# Patient Record
Sex: Male | Born: 1975 | Hispanic: Yes | Marital: Single | Smoking: Current every day smoker
Health system: Southern US, Community
[De-identification: ages and names within clinical notes are randomized; demographics above are authoritative.]

## PROBLEM LIST (undated history)

## (undated) ENCOUNTER — Emergency Department (HOSPITAL_COMMUNITY): Payer: Self-pay

## (undated) DIAGNOSIS — Z789 Other specified health status: Secondary | ICD-10-CM

## (undated) HISTORY — PX: NO PAST SURGERIES: SHX2092

---

## 2013-06-01 ENCOUNTER — Encounter (HOSPITAL_COMMUNITY): Payer: Self-pay | Admitting: Emergency Medicine

## 2013-06-01 ENCOUNTER — Emergency Department (HOSPITAL_COMMUNITY)
Admission: EM | Admit: 2013-06-01 | Discharge: 2013-06-02 | Disposition: A | Discharge status: Other Acute Inpatient Hospital | Payer: Self-pay | Attending: Emergency Medicine | Admitting: Emergency Medicine

## 2013-06-01 DIAGNOSIS — F1210 Cannabis abuse, uncomplicated: Secondary | ICD-10-CM | POA: Insufficient documentation

## 2013-06-01 DIAGNOSIS — R238 Other skin changes: Secondary | ICD-10-CM | POA: Insufficient documentation

## 2013-06-01 DIAGNOSIS — F1010 Alcohol abuse, uncomplicated: Secondary | ICD-10-CM | POA: Insufficient documentation

## 2013-06-01 DIAGNOSIS — F17200 Nicotine dependence, unspecified, uncomplicated: Secondary | ICD-10-CM | POA: Insufficient documentation

## 2013-06-01 LAB — COMPREHENSIVE METABOLIC PANEL
ALK PHOS: 76 U/L (ref 39–117)
ALT: 25 U/L (ref 0–53)
AST: 28 U/L (ref 0–37)
Albumin: 4.2 g/dL (ref 3.5–5.2)
BILIRUBIN TOTAL: 0.4 mg/dL (ref 0.3–1.2)
BUN: 8 mg/dL (ref 6–23)
CO2: 22 meq/L (ref 19–32)
Calcium: 9.5 mg/dL (ref 8.4–10.5)
Chloride: 107 mEq/L (ref 96–112)
Creatinine, Ser: 0.72 mg/dL (ref 0.50–1.35)
GLUCOSE: 93 mg/dL (ref 70–99)
POTASSIUM: 4.5 meq/L (ref 3.7–5.3)
SODIUM: 144 meq/L (ref 137–147)
Total Protein: 7.3 g/dL (ref 6.0–8.3)

## 2013-06-01 LAB — CBC
HEMATOCRIT: 44.1 % (ref 39.0–52.0)
HEMOGLOBIN: 14.8 g/dL (ref 13.0–17.0)
MCH: 31.3 pg (ref 26.0–34.0)
MCHC: 33.6 g/dL (ref 30.0–36.0)
MCV: 93.2 fL (ref 78.0–100.0)
Platelets: 179 10*3/uL (ref 150–400)
RBC: 4.73 MIL/uL (ref 4.22–5.81)
RDW: 12.7 % (ref 11.5–15.5)
WBC: 4.3 10*3/uL (ref 4.0–10.5)

## 2013-06-01 LAB — RAPID URINE DRUG SCREEN, HOSP PERFORMED
Amphetamines: NOT DETECTED
Barbiturates: NOT DETECTED
Benzodiazepines: NOT DETECTED
Cocaine: NOT DETECTED
Opiates: NOT DETECTED
TETRAHYDROCANNABINOL: POSITIVE — AB

## 2013-06-01 LAB — SALICYLATE LEVEL: Salicylate Lvl: <2 mg/dL — ABNORMAL LOW (ref 2.8–20.0)

## 2013-06-01 LAB — ACETAMINOPHEN LEVEL: Acetaminophen (Tylenol), Serum: <15 ug/mL (ref 10–30)

## 2013-06-01 LAB — ETHANOL: Alcohol, Ethyl (B): 84 mg/dL — ABNORMAL HIGH (ref 0–11)

## 2013-06-01 MED ORDER — ONDANSETRON HCL 4 MG PO TABS
4.0000 mg | ORAL_TABLET | Freq: Three times a day (TID) | ORAL | Status: DC | PRN
  Administered 2013-06-01: 4 mg via ORAL
  Filled 2013-06-01: qty 1

## 2013-06-01 MED ORDER — THIAMINE HCL 100 MG/ML IJ SOLN
Freq: Once | INTRAVENOUS | Status: AC
  Administered 2013-06-01: 11:00:00 via INTRAVENOUS
  Filled 2013-06-01: qty 1000

## 2013-06-01 MED ORDER — NICOTINE 21 MG/24HR TD PT24: 21.0000 mg | MEDICATED_PATCH | Freq: Every day | TRANSDERMAL | Status: DC

## 2013-06-01 MED ORDER — LORAZEPAM 2 MG/ML IJ SOLN
1.0000 mg | Freq: Once | INTRAMUSCULAR | Status: AC
  Administered 2013-06-01: 1 mg via INTRAVENOUS
  Filled 2013-06-01: qty 1

## 2013-06-01 MED ORDER — GI COCKTAIL ~~LOC~~
30.0000 mL | Freq: Once | ORAL | Status: AC
  Administered 2013-06-01: 30 mL via ORAL
  Filled 2013-06-01: qty 30

## 2013-06-01 MED ORDER — ACETAMINOPHEN 325 MG PO TABS: 650.0000 mg | ORAL_TABLET | ORAL | Status: DC | PRN

## 2013-06-01 MED ORDER — ALUM & MAG HYDROXIDE-SIMETH 200-200-20 MG/5ML PO SUSP: 30.0000 mL | ORAL | Status: DC | PRN

## 2013-06-01 MED ORDER — IBUPROFEN 400 MG PO TABS: 600.0000 mg | ORAL_TABLET | Freq: Three times a day (TID) | ORAL | Status: DC | PRN

## 2013-06-01 MED ORDER — ZOLPIDEM TARTRATE 5 MG PO TABS
5.0000 mg | ORAL_TABLET | Freq: Every evening | ORAL | Status: DC | PRN
Start: 2013-06-01 — End: 2013-06-02

## 2013-06-01 MED ORDER — LORAZEPAM 1 MG PO TABS: 0.0000 mg | ORAL_TABLET | Freq: Two times a day (BID) | ORAL | Status: DC

## 2013-06-01 MED ORDER — LORAZEPAM 1 MG PO TABS
0.0000 mg | ORAL_TABLET | Freq: Four times a day (QID) | ORAL | Status: DC
  Administered 2013-06-02: 1 mg via ORAL
  Filled 2013-06-01: qty 1

## 2013-06-01 MED ORDER — LORAZEPAM 1 MG PO TABS: 1.0000 mg | ORAL_TABLET | Freq: Three times a day (TID) | ORAL | Status: DC | PRN

## 2013-06-01 NOTE — Progress Notes (Signed)
Writer faxed referral to [facility] and [facility]. 

## 2013-06-01 NOTE — BH Assessment (Signed)
Tele Assessment Note   Perry Mercado is an 38 y.o. hispanic male that requests detox from alcohol.  Patient reports that he has been addicted to alcohol for over 20 years.   Patient reports that he drinks 12-14 beers daily.  Patient reports that he drank 25-30 beers yesterday.  Patient reports that his longest period of sobriety was for 4-5 days.  Patient reports that he and 3 other room mates rent a house. Patient reports that he and his other room mates drink together on a daily basis.  Patient denies previous treatment at another detox or treatment facility.  Patient reports withdrawal symptoms to include sweats, fatigue, slight nausea, cramping, irritability.  Patient denied DT's and seizures.  Patient has a CIWA score of 10.   Patient denies SI/HI/Psychosis.  Patient prior mental health treatment.  Patient denies previous psychiatric hospitalization.  Patient denies a history of physical, sexual or emotional abuse.  During the assessment a translator phone was used.     Axis I: Alcohol Abuse  Axis II: Deferred Axis III: History reviewed. No pertinent past medical history. Axis IV: economic problems, educational problems, housing problems, occupational problems, other psychosocial or environmental problems, problems related to social environment, problems with access to health care services and problems with primary support group Axis V: 31-40 impairment in reality testing  Past Medical History: History reviewed. No pertinent past medical history.  History reviewed. No pertinent past surgical history.  Family History: No family history on file.  Social History:  reports that he has been smoking.  He does not have any smokeless tobacco history on file. He reports that he drinks alcohol. He reports that he does not use illicit drugs.  Additional Social History:     CIWA: CIWA-Ar BP: 109/65 mmHg Pulse Rate: 69 Nausea and Vomiting: mild nausea with no vomiting Tactile Disturbances: mild  itching, pins and needles, burning or numbness Tremor: not visible, but can be felt fingertip to fingertip Auditory Disturbances: not present Paroxysmal Sweats: no sweat visible Visual Disturbances: not present Anxiety: moderately anxious, or guarded, so anxiety is inferred Headache, Fullness in Head: mild Agitation: normal activity Orientation and Clouding of Sensorium: oriented and can do serial additions CIWA-Ar Total: 10 COWS:    Allergies: No Known Allergies  Home Medications:  (Not in a hospital admission)  OB/GYN Status:  No LMP for male patient.  General Assessment Data Location of Assessment: [facility] Is this a Tele or Face-to-Face Assessment?: Tele Assessment Is this an Initial Assessment or a Re-assessment for this encounter?: Initial Assessment Can pt return to current living arrangement?: Yes Admission Status: Voluntary Is patient capable of signing voluntary admission?: Yes Transfer [facility]: [facility] ) Referral Source: Self/Family/Friend  Medical Screening Exam [facility] Walk-in ONLY) Medical Exam completed: Yes  [facility] Crisis Care Plan Name of Psychiatrist: None Reported Name of Therapist: None Reported  Education Status Is patient currently in school?: No Current Grade: NA Highest grade of school patient has completed: NA Name of school: NA Contact person: None Reported  Risk to self Suicidal Ideation: No Suicidal Intent: No Is patient at risk for suicide?: No Suicidal Plan?: No Access to Means: No What has been your use of drugs/alcohol within the last 12 months?: Alcohol Previous Attempts/Gestures: No How many times?: 0 Other Self Harm Risks: None Reported Triggers for Past Attempts: Other (Comment) Intentional Self Injurious Behavior: None Family Suicide History: No Recent stressful life event(s): Job Loss;Financial Problems Persecutory voices/beliefs?: No Depression: Yes Depression  Symptoms: Despondent;Feeling  worthless/self pity Substance abuse history and/or treatment for substance abuse?: Yes Suicide prevention information given to non-admitted patients: Not applicable  Risk to Others Homicidal Ideation: No Thoughts of Harm to Others: No Current Homicidal Intent: No Current Homicidal Plan: No Access to Homicidal Means: No Identified Victim: None Reported History of harm to others?: No Assessment of Violence: None Noted Violent Behavior Description: Calm Does patient have access to weapons?: Yes (Comment) Criminal Charges Pending?: No Does patient have a court date: No  Psychosis Hallucinations: None noted Delusions: None noted  Mental Status Report Appear/Hygiene: Disheveled Eye Contact: Fair Motor Activity: Freedom of movement Speech: Logical/coherent Level of Consciousness: Alert Mood: Anxious Affect: Anxious Anxiety Level: Minimal Thought Processes: Coherent;Relevant Judgement: Unimpaired Orientation: Person;Place;Time;Situation Obsessive Compulsive Thoughts/Behaviors: None  Cognitive Functioning Concentration: Decreased Memory: Remote Intact;Recent Intact IQ: Average Insight: Fair Impulse Control: Poor Appetite: Fair Weight Loss: 0 Weight Gain: 0 Sleep: Decreased Total Hours of Sleep: 4 Vegetative Symptoms: None  ADLScreening [facility] Services) Patient's cognitive ability adequate to safely complete daily activities?: Yes Patient able to express need for assistance with ADLs?: Yes Independently performs ADLs?: Yes (appropriate for developmental age)  Prior Inpatient Therapy Prior Inpatient Therapy: No Prior Therapy Dates: NA Prior Therapy Facilty/Provider(s): NA Reason for Treatment: NA  Prior Outpatient Therapy Prior Outpatient Therapy: No Prior Therapy Dates: NA Prior Therapy Facilty/Provider(s): NA Reason for Treatment: NA  ADL Screening (condition at time of admission) Patient's cognitive ability adequate to safely complete daily  activities?: Yes Patient able to express need for assistance with ADLs?: Yes Independently performs ADLs?: Yes (appropriate for developmental age)         Values / Beliefs Cultural Requests During Hospitalization: None Spiritual Requests During Hospitalization: None     Nutrition Screen- [facility] Adult/[facility]/AP Patient's home diet: Regular  Additional Information 1:1 In Past 12 Months?: No CIRT Risk: No Elopement Risk: No Does patient have medical clearance?: Yes     Disposition: Per, Renata Capriceonrad, NP - patient meets criteria for inpatient hospitalization.  [facility]  Writer will refer patient to other facilities.  Disposition Initial Assessment Completed for this Encounter: Yes Disposition of Patient: Other dispositions Other disposition(s): Other (Comment)  Perry Mercado 06/01/2013 1:56 PM

## 2013-06-01 NOTE — ED Notes (Signed)
Pt here for detox from etoh, here with friend, there is language barrier noted but pt understands why he is here per friend at bedside that does speak "descent Spanish"

## 2013-06-01 NOTE — ED Provider Notes (Signed)
Medical screening examination/treatment/procedure(s) were conducted as a shared visit with non-physician practitioner(s) and myself.  I personally evaluated the patient during the encounter.   EKG Interpretation None       Doug Sou, MD 06/01/13 [phone]

## 2013-06-01 NOTE — ED Notes (Signed)
This RN called [facility]- states has male detox bed for patient. Ava with [facility] notified.

## 2013-06-01 NOTE — ED Notes (Signed)
Ordered diet tray 

## 2013-06-01 NOTE — ED Provider Notes (Signed)
Patient speaks no English history is obtained from medical interpreter using lambert line. Patient requests detox from alcohol. Last drink alcohol last night he presently complains of vague abdominal discomfort, at epigastrium. He feels much improved since being in the emergency department. He is alert Glasgow Coma Score 15. No distress. Maalox ordered  Doug Sou, MD 06/01/13 1216

## 2013-06-01 NOTE — ED Notes (Signed)
telepsych in progress 

## 2013-06-01 NOTE — ED Provider Notes (Signed)
CSN: [id number]     Arrival date & time 06/01/13  5397 History   First MD Initiated Contact with Patient 06/01/13 1015     Chief Complaint  Patient presents with  . Medical Clearance  . Alcohol Problem     (Consider location/radiation/quality/duration/timing/severity/associated sxs/prior Treatment) Patient is a 38 y.o. male presenting with alcohol problem. The history is provided by the patient and medical records.  Alcohol Problem  This is a 38 year old male presenting to the ED for detox from EtOH.  Patient has been a daily drinker for the past 6 years, variable in amount, but up to as many as 30 beers in a 24-hour period.  Last drink was last night around 2200, estimated he had 25+ beers yesterday but lost count.  Denies illicit drug use.  Pt has never attempted detox before.  He states he does feel somewhat shaky and is nauseated.  No vomiting or seizure activity.  No chest pain or SOB.  No abdominal pain.  VS stable on arrival.  History reviewed. No pertinent past medical history. History reviewed. No pertinent past surgical history. No family history on file. History  Substance Use Topics  . Smoking status: Current Every Day Smoker  . Smokeless tobacco: Not on file  . Alcohol Use: Yes    Review of Systems  Psychiatric/Behavioral:       Detox  All other systems reviewed and are negative.     Allergies  Review of patient's allergies indicates no known allergies.  Home Medications   Prior to Admission medications   Not on File   BP 124/78  Pulse 82  Temp(Src) 98.4 F (36.9 C) (Oral)  Resp 18  Ht 5\' 7"  (1.702 m)  Wt 160 lb (72.576 kg)  BMI 25.05 kg/m2  SpO2 98%  Physical Exam  Nursing note and vitals reviewed. Constitutional: He is oriented to person, place, and time. He appears well-developed and well-nourished. No distress.  HENT:  Head: Normocephalic and atraumatic.  Mouth/Throat: Oropharynx is clear and moist.  Dry mucous membranes  Eyes: Conjunctivae  and EOM are normal. Pupils are equal, round, and reactive to light.  Neck: Normal range of motion. Neck supple.  Cardiovascular: Normal rate, regular rhythm and normal heart sounds.   Pulmonary/Chest: Effort normal and breath sounds normal. No respiratory distress. He has no wheezes.  Abdominal: Soft. Bowel sounds are normal. There is no tenderness. There is no guarding.  Musculoskeletal: Normal range of motion. He exhibits no edema.  Neurological: He is alert and oriented to person, place, and time.  Skin: Skin is warm and dry. He is not diaphoretic.  Psychiatric: He has a normal mood and affect.    ED Course  Procedures (including critical care time) Labs Review Labs Reviewed  ETHANOL - Abnormal; Notable for the following:    Alcohol, Ethyl (B) 84 (*)    All other components within normal limits  SALICYLATE LEVEL - Abnormal; Notable for the following:    Salicylate Lvl <2.0 (*)    All other components within normal limits  URINE RAPID DRUG SCREEN (HOSP PERFORMED) - Abnormal; Notable for the following:    Tetrahydrocannabinol POSITIVE (*)    All other components within normal limits  ACETAMINOPHEN LEVEL  CBC  COMPREHENSIVE METABOLIC PANEL    Imaging Review No results found.   EKG Interpretation None      MDM   Final diagnoses:  Alcohol abuse   38 year old male requesting detox from alcohol. He has been a daily drinker for  the past 6 years, last drink was around 10 PM last night. On exam, patient is afebrile and overall nontoxic appearing. He has no noted tremors or seizure activity. Some nausea but denies vomiting. Patient is not actively withdrawing at this time.  Basic labs were obtained. He was given a banana bag of fluids and dose of Ativan and zofran.  Labs as above, ethanol 84. UDS positive for THC. After medications, patient states he feels better. He is requesting to eat. Meal tray was ordered for him. Pt is medically cleared and awaiting [facility] evaluation.  VS  remain stable.  [facility] has evaluated pt-- meets IP criteria, awaiting placement.  Garlon HatchetLisa M Demarco Bacci, PA-C 06/01/13 [phone]

## 2013-06-01 NOTE — Progress Notes (Signed)
Per Renata Caprice, NP - patient meets criteria for inpatient hospitalization.

## 2013-06-02 ENCOUNTER — Inpatient Hospital Stay (HOSPITAL_COMMUNITY)
Admission: AD | Admit: 2013-06-02 | Discharge: 2013-06-04 | DRG: 897 | Disposition: A | Discharge status: Home / Self Care | Payer: Federal, State, Local not specified - Other | Source: Intra-hospital | Attending: Psychiatry | Admitting: Psychiatry

## 2013-06-02 ENCOUNTER — Encounter (HOSPITAL_COMMUNITY): Payer: Self-pay | Admitting: *Deleted

## 2013-06-02 ENCOUNTER — Encounter (HOSPITAL_COMMUNITY): Payer: Self-pay | Admitting: Emergency Medicine

## 2013-06-02 DIAGNOSIS — F1010 Alcohol abuse, uncomplicated: Secondary | ICD-10-CM | POA: Diagnosis present

## 2013-06-02 DIAGNOSIS — F1020 Alcohol dependence, uncomplicated: Principal | ICD-10-CM | POA: Diagnosis present

## 2013-06-02 DIAGNOSIS — G4700 Insomnia, unspecified: Secondary | ICD-10-CM | POA: Diagnosis present

## 2013-06-02 DIAGNOSIS — F320 Major depressive disorder, single episode, mild: Secondary | ICD-10-CM | POA: Diagnosis present

## 2013-06-02 DIAGNOSIS — F17200 Nicotine dependence, unspecified, uncomplicated: Secondary | ICD-10-CM | POA: Diagnosis present

## 2013-06-02 HISTORY — DX: Other specified health status: Z78.9

## 2013-06-02 MED ORDER — CHLORDIAZEPOXIDE HCL 25 MG PO CAPS: 25.0000 mg | ORAL_CAPSULE | ORAL | Status: DC

## 2013-06-02 MED ORDER — CHLORDIAZEPOXIDE HCL 25 MG PO CAPS: 25.0000 mg | ORAL_CAPSULE | Freq: Every day | ORAL | Status: DC

## 2013-06-02 MED ORDER — THIAMINE HCL 100 MG/ML IJ SOLN: 100.0000 mg | Freq: Once | INTRAMUSCULAR | Status: DC

## 2013-06-02 MED ORDER — LOPERAMIDE HCL 2 MG PO CAPS: 2.0000 mg | ORAL_CAPSULE | ORAL | Status: DC | PRN

## 2013-06-02 MED ORDER — ALUM & MAG HYDROXIDE-SIMETH 200-200-20 MG/5ML PO SUSP: 30.0000 mL | ORAL | Status: DC | PRN

## 2013-06-02 MED ORDER — VITAMIN B-1 100 MG PO TABS
100.0000 mg | ORAL_TABLET | Freq: Every day | ORAL | Status: DC
  Administered 2013-06-03 – 2013-06-04 (×2): 100 mg via ORAL
  Filled 2013-06-02 (×4): qty 1

## 2013-06-02 MED ORDER — MAGNESIUM HYDROXIDE 400 MG/5ML PO SUSP: 30.0000 mL | Freq: Every day | ORAL | Status: DC | PRN

## 2013-06-02 MED ORDER — HYDROXYZINE HCL 25 MG PO TABS: 25.0000 mg | ORAL_TABLET | Freq: Four times a day (QID) | ORAL | Status: DC | PRN

## 2013-06-02 MED ORDER — TRAZODONE HCL 50 MG PO TABS
50.0000 mg | ORAL_TABLET | Freq: Every evening | ORAL | Status: DC | PRN
  Administered 2013-06-02 – 2013-06-03 (×2): 50 mg via ORAL
  Filled 2013-06-02: qty 1
  Filled 2013-06-02: qty 28
  Filled 2013-06-02: qty 1
  Filled 2013-06-02 (×2): qty 28
  Filled 2013-06-02: qty 1
  Filled 2013-06-02: qty 28
  Filled 2013-06-02 (×3): qty 1

## 2013-06-02 MED ORDER — CHLORDIAZEPOXIDE HCL 25 MG PO CAPS
25.0000 mg | ORAL_CAPSULE | Freq: Four times a day (QID) | ORAL | Status: AC
  Administered 2013-06-02 – 2013-06-04 (×6): 25 mg via ORAL
  Filled 2013-06-02 (×5): qty 1

## 2013-06-02 MED ORDER — ACETAMINOPHEN 325 MG PO TABS: 650.0000 mg | ORAL_TABLET | Freq: Four times a day (QID) | ORAL | Status: DC | PRN

## 2013-06-02 MED ORDER — CHLORDIAZEPOXIDE HCL 25 MG PO CAPS: 25.0000 mg | ORAL_CAPSULE | Freq: Three times a day (TID) | ORAL | Status: DC

## 2013-06-02 MED ORDER — ADULT MULTIVITAMIN W/MINERALS CH
1.0000 | ORAL_TABLET | Freq: Every day | ORAL | Status: DC
  Administered 2013-06-03 – 2013-06-04 (×2): 1 via ORAL
  Filled 2013-06-02 (×4): qty 1

## 2013-06-02 MED ORDER — ONDANSETRON 4 MG PO TBDP: 4.0000 mg | ORAL_TABLET | Freq: Four times a day (QID) | ORAL | Status: DC | PRN

## 2013-06-02 MED ORDER — CHLORDIAZEPOXIDE HCL 25 MG PO CAPS: 25.0000 mg | ORAL_CAPSULE | Freq: Four times a day (QID) | ORAL | Status: DC | PRN

## 2013-06-02 NOTE — ED Notes (Signed)
PATIENT HAS BEEN DECLINED AT [facility] PER MELISSA

## 2013-06-02 NOTE — Tx Team (Signed)
Initial Interdisciplinary Treatment Plan  PATIENT STRENGTHS: (choose at least two) Ability for insight Active sense of humor Average or above average intelligence Capable of independent living Motivation for treatment/growth Physical Health Supportive family/friends Work skills  PATIENT STRESSORS: Substance abuse   PROBLEM LIST: Problem List/Patient Goals Date to be addressed Date deferred Reason deferred Estimated date of resolution  I want to get off alcohol 02 Jun 2013                                                      DISCHARGE CRITERIA:  Ability to meet basic life and health needs Adequate post-discharge living arrangements Improved stabilization in mood, thinking, and/or behavior Verbal commitment to aftercare and medication compliance Withdrawal symptoms are absent or subacute and managed without 24-hour nursing intervention  PRELIMINARY DISCHARGE PLAN: Attend aftercare/continuing care group Return to previous work or school arrangements  PATIENT/FAMIILY INVOLVEMENT: This treatment plan has been presented to and reviewed with the patient, Perry Mercado, and/or family member.  The patient and family have been given the opportunity to ask questions and make suggestions.  Abby Tucholski M Dietra Stokely 06/02/2013, 7:00 PM

## 2013-06-02 NOTE — ED Notes (Signed)
SPOKE TO NICOLE AT [facility] . PATIENT HAS BEEN DECLINED AT THEIR FACILITY

## 2013-06-02 NOTE — ED Notes (Signed)
CALLED AND MADE PAIGE AWARE THAT PT HAS BEEN DECLINED AT [facility] AND [facility]. PAIGE IS GOING TO CHECK AND SEE IF THERE WILL BE [address]ED[address] TODAY. SHE WILL CALL BACK

## 2013-06-02 NOTE — [facility] Counselor (Addendum)
Update - after speaking with Renata Caprice NP and Julieanne Cotton, pt will be going to bed 303-2. Writer notified Musician.  Evette Cristal, [place] Assessment Counselor 12:41 pm   Per Julieanne Cotton at [facility], pt accepted to OBS #1. Writer called and spoke w/ Drinda Butts RN who will let Becky RN know.  Evette Cristal, [place] Assessment Counselor 12:31 pm

## 2013-06-02 NOTE — Progress Notes (Signed)
Patient ID: Perry Mercado, male   DOB: 11/22/1976, 38 y.o.   MRN: [id number] D:  38 year old Latino male admitted for alcohol detox.  Patient states he drinks 12-24 beers every 2-3 days.  States he only smokes cigarettes when he is drinking.  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  States his boss is supportive.  He speaks minimal [place].    A:  Admission completed to the best of my ability.  He was introduced to Dr. Dub Mikes in order to build his comfort level.  Patient was oriented to the unit and escorted to the dining room for dinner.   R:  Patient was very pleasant and cooperative with the admission process.  He was able to work through the admission with my minimal Spanish and his minimal [place].  He was able to verbalize that he wants to stop drinking alcohol completely.  States "no more."  Skin assessment revealed some well healed scars on his upper chest, otherwise unremarkable.

## 2013-06-02 NOTE — ED Notes (Signed)
Pt states the last time he had a drink was Sunday, 25 beers.

## 2013-06-02 NOTE — Discharge Instructions (Signed)
TO [facility] FOR ADMISSION - ACCEPTED BY DR. Dub Mikes.

## 2013-06-02 NOTE — ED Provider Notes (Signed)
On morning rounds the patient was sleeping. Per report, no new complaints, no rhythm abnormalities, patient is awaiting assistance for placement.  Gerhard Munch, MD 06/02/13 1202

## 2013-06-03 ENCOUNTER — Encounter (HOSPITAL_COMMUNITY): Payer: Self-pay | Admitting: Psychiatry

## 2013-06-03 DIAGNOSIS — F1020 Alcohol dependence, uncomplicated: Secondary | ICD-10-CM | POA: Diagnosis present

## 2013-06-03 DIAGNOSIS — F1994 Other psychoactive substance use, unspecified with psychoactive substance-induced mood disorder: Secondary | ICD-10-CM

## 2013-06-03 NOTE — Progress Notes (Signed)
Adult Psychoeducational Group Note  Date:  06/03/2013 Time:  12:56 AM  Group Topic/Focus:  Wrap-Up Group:   The focus of this group is to help patients review their daily goal of treatment and discuss progress on daily workbooks.  Participation Level:  Did Not Attend   Additional Comments:  Pt did not attend wrap up group. He slept instead.    Megumi Treaster A Keylah Darwish 06/03/2013, 12:56 AM 

## 2013-06-03 NOTE — [facility] Group Notes (Signed)
[facility] LCSW Group Therapy  06/03/2013 3:27 PM  Type of Therapy:  Group Therapy  Participation Level:  Minimal  Participation Quality:  Attentive  Affect:  Appropriate  Cognitive:  Alert and Oriented  Insight:  Improving  Engagement in Therapy:  Improving  Modes of Intervention:  Confrontation, Discussion, Education, Exploration, Problem-solving, Rapport Building, Socialization and Support  Summary of Progress/Problems: Today's Topic: Overcoming Obstacles. Pt identified obstacles faced currently and processed barriers involved in overcoming these obstacles. Pt identified steps necessary for overcoming these obstacles and explored motivation (internal and external) for facing these difficulties head on. Pt further identified one area of concern in their lives and chose a skill of focus pulled from their "toolbox." Perry Mercado was attentive and engaged throughout today's therapy group. He minimally participated in group discussion unless directly asked a question by CSW. Pt listened attentively as others participated in group discussion and had interpretor with him during group. Perry Mercado shared that he plans to move out of his home at d/c being that his roommates drink heavily and reported that this is not a good environment for him. PT reports that he is anxious to return to work and d/c tomorrow. Perry Mercado shows progress in the group setting and improving insight AEB his ability to process how returning to work, going to therapy, taking his medications on time, and staying away from negative influences will help him to maintain sobriety in the future.    Perry Mercado Smart LCSWA  06/03/2013, 3:27 PM

## 2013-06-03 NOTE — [facility] Suicide Risk Assessment (Signed)
[facility] INPATIENT: Family/Significant Other Suicide Prevention Education  Suicide Prevention Education:  Education Completed; No one has been identified by the patient as the family member/significant other with whom the patient will be residing, and identified as the person(s) who will aid the patient in the event of a mental health crisis (suicidal ideations/suicide attempt).   Pt did not c/o SI at admission, nor have they endorsed SI during their stay here. SPE not required. SPI pamphlet provided to pt and he was encouraged to share information with support network, ask questions, and talk about any concerns relating to SPE.  [employer], LCSWA 06/03/2013 11:37 AM

## 2013-06-03 NOTE — H&P (Signed)
Psychiatric Admission Assessment Adult  Patient Identification:  Perry Mercado Date of Evaluation:  06/03/2013 Chief Complaint:  ALCOHOL ABUSE History of Present Illness:: 38 Y/O male who states he drinks heavily on weekends. States during the weekdays he drinks one or two on weekends can drink up to 25 beers in a day. States he has been drinking since he was 54. States the longest time abstinent has been a week, two weeks. States when he drinks he smokes marijuana. 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 He states his boss knows he is here and is supportive of him. He wants to eventually have a company of his own. He stays with some "frineds" they all drink and do drugs what he admits triggers him. He is planning to get another place to live.  Associated Signs/Synptoms: Depression Symptoms:  disturbed sleep, (Hypo) Manic Symptoms:  denies Anxiety Symptoms:  denies Psychotic Symptoms:  denies PTSD Symptoms: Negative Total Time spent with patient: 45 minutes  Psychiatric Specialty Exam: Physical Exam  Review of Systems  Constitutional: Positive for malaise/fatigue.  HENT: Negative.   Eyes: Negative.   Respiratory: Negative.   Cardiovascular: Negative.   Gastrointestinal: Negative.   Genitourinary: Negative.   Musculoskeletal: Negative.   Skin: Negative.   Neurological: Positive for weakness.  Endo/Heme/Allergies: Negative.   Psychiatric/Behavioral: Positive for substance abuse. The patient is nervous/anxious and has insomnia.     Blood pressure 116/86, pulse 99, temperature 98.5 F (36.9 C), temperature source Oral, resp. rate 18, height $RemoveBe'6\' 8"'GzfmyAvmu$  (2.032 m), weight 79.833 kg (176 lb).Body mass index is 19.33 kg/(m^2).  General Appearance: Fairly Groomed  Engineer, water::  Fair  Speech:  Clear and Coherent  Volume:  Normal  Mood:  worried  Affect:  worried  Thought Process:  Coherent and Goal Directed  Orientation:  Full (Time, Place, and Person)  Thought  Content:  symptoms, worries, concerns  Suicidal Thoughts:  No  Homicidal Thoughts:  No  Memory:  Immediate;   Fair Recent;   Fair Remote;   Fair  Judgement:  Fair  Insight:  Present  Psychomotor Activity:  Restlessness  Concentration:  Fair  Recall:  [employer] of Knowledge:NA  Language: Fair  Akathisia:  No  Handed:    AIMS (if indicated):     Assets:  Desire for Improvement Vocational/Educational  Sleep:  Number of Hours: 6.25    Musculoskeletal: Strength & Muscle Tone: within normal limits Gait & Station: normal Patient leans: N/A  Past Psychiatric History: Diagnosis:  Hospitalizations: Denies  Outpatient Care: Denies  Substance Abuse Care: Denies  Self-Mutilation: Denies  Suicidal Attempts: Denies  Violent Behaviors: Denies   Past Medical History:   Past Medical History  Diagnosis Date  . Medical history non-contributory    None. Allergies:  No Known Allergies PTA Medications: No prescriptions prior to admission    Previous Psychotropic Medications:  Medication/Dose                 Substance Abuse History in the last 12 months:  yes  Consequences of Substance Abuse: Blackouts:   Withdrawal Symptoms:   Diaphoresis Headaches Nausea Tremors  Social History:  reports that he has been smoking Cigarettes.  He has been smoking about 2.00 packs per day. He does not have any smokeless tobacco history on file. He reports that he drinks alcohol. He reports that he does not use illicit drugs. Additional Social History:  Current Place of Residence:  Lives with some "friends" but states he will move as they drink and use drugs Place of Birth:   Family Members: Marital Status:  Single Children: Denes  Sons:  Daughters: Relationships: Education:  5 th grade in [place] Educational Problems/Performance: Religious Beliefs/Practices: Catholic  History of Abuse (Emotional/Phsycial/Sexual) Denies Pensions consultant;  Construction/Painting  Military History:  None. Legal History: Public intoxication Hobbies/Interests:  Family History:  No family history on file.  Results for orders placed during the hospital encounter of 06/01/13 (from the past 72 hour(s))  ACETAMINOPHEN LEVEL     Status: None   Collection Time    06/01/13 10:35 AM      Result Value Ref Range   Acetaminophen (Tylenol), Serum <15.0  10 - 30 ug/mL   Comment:            THERAPEUTIC CONCENTRATIONS VARY     SIGNIFICANTLY. A RANGE OF 10-30     ug/mL MAY BE AN EFFECTIVE     CONCENTRATION FOR MANY PATIENTS.     HOWEVER, SOME ARE BEST TREATED     AT CONCENTRATIONS OUTSIDE THIS     RANGE.     ACETAMINOPHEN CONCENTRATIONS     >150 ug/mL AT 4 HOURS AFTER     INGESTION AND >50 ug/mL AT 12     HOURS AFTER INGESTION ARE     OFTEN ASSOCIATED WITH TOXIC     REACTIONS.  CBC     Status: None   Collection Time    06/01/13 10:35 AM      Result Value Ref Range   WBC 4.3  4.0 - 10.5 K/uL   RBC 4.73  4.22 - 5.81 MIL/uL   Hemoglobin 14.8  13.0 - 17.0 g/dL   HCT 44.1  39.0 - 52.0 %   MCV 93.2  78.0 - 100.0 fL   MCH 31.3  26.0 - 34.0 pg   MCHC 33.6  30.0 - 36.0 g/dL   RDW 12.7  11.5 - 15.5 %   Platelets 179  150 - 400 K/uL  COMPREHENSIVE METABOLIC PANEL     Status: None   Collection Time    06/01/13 10:35 AM      Result Value Ref Range   Sodium 144  137 - 147 mEq/L   Potassium 4.5  3.7 - 5.3 mEq/L   Chloride 107  96 - 112 mEq/L   CO2 22  19 - 32 mEq/L   Glucose, Bld 93  70 - 99 mg/dL   BUN 8  6 - 23 mg/dL   Creatinine, Ser 0.72  0.50 - 1.35 mg/dL   Calcium 9.5  8.4 - 10.5 mg/dL   Total Protein 7.3  6.0 - 8.3 g/dL   Albumin 4.2  3.5 - 5.2 g/dL   AST 28  0 - 37 U/L   ALT 25  0 - 53 U/L   Alkaline Phosphatase 76  39 - 117 U/L   Total Bilirubin 0.4  0.3 - 1.2 mg/dL   GFR calc non Af Amer >90  >90 mL/min   GFR calc Af Amer >90  >90 mL/min   Comment: (NOTE)     The eGFR has been calculated using the CKD EPI equation.     This  calculation has not been validated in all clinical situations.     eGFR's persistently <90 mL/min signify possible Chronic Kidney     Disease.  ETHANOL     Status: Abnormal   Collection Time  06/01/13 10:35 AM      Result Value Ref Range   Alcohol, Ethyl (B) 84 (*) 0 - 11 mg/dL   Comment:            LOWEST DETECTABLE LIMIT FOR     SERUM ALCOHOL IS 11 mg/dL     FOR MEDICAL PURPOSES ONLY  SALICYLATE LEVEL     Status: Abnormal   Collection Time    06/01/13 10:35 AM      Result Value Ref Range   Salicylate Lvl <9.6 (*) 2.8 - 20.0 mg/dL  URINE RAPID DRUG SCREEN (HOSP PERFORMED)     Status: Abnormal   Collection Time    06/01/13 10:44 AM      Result Value Ref Range   Opiates NONE DETECTED  NONE DETECTED   Cocaine NONE DETECTED  NONE DETECTED   Benzodiazepines NONE DETECTED  NONE DETECTED   Amphetamines NONE DETECTED  NONE DETECTED   Tetrahydrocannabinol POSITIVE (*) NONE DETECTED   Barbiturates NONE DETECTED  NONE DETECTED   Comment:            DRUG SCREEN FOR MEDICAL PURPOSES     ONLY.  IF CONFIRMATION IS NEEDED     FOR ANY PURPOSE, NOTIFY LAB     WITHIN 5 DAYS.                LOWEST DETECTABLE LIMITS     FOR URINE DRUG SCREEN     Drug Class       Cutoff (ng/mL)     Amphetamine      1000     Barbiturate      200     Benzodiazepine   789     Tricyclics       381     Opiates          300     Cocaine          300     THC              50   Psychological Evaluations:  Assessment:   DSM5:  Schizophrenia Disorders:  denies Obsessive-Compulsive Disorders:  denies Trauma-Stressor Disorders:  denies Substance/Addictive Disorders:  Alcohol Related Disorder - Severe (303.90) Depressive Disorders:  Major Depressive Disorder - Mild (296.21)  AXIS I:  Substance Induced Mood Disorder AXIS II:  Deferred AXIS III:   Past Medical History  Diagnosis Date  . Medical history non-contributory    AXIS IV:  other psychosocial or environmental problems AXIS V:  41-50 serious  symptoms  Treatment Plan/Recommendations:  Supportive approach/coping skills/relapse prevention                                                                 Librium detox protocol  Treatment Plan Summary: Daily contact with patient to assess and evaluate symptoms and progress in treatment Medication management Current Medications:  Current Facility-Administered Medications  Medication Dose Route Frequency Provider Last Rate Last Dose  . acetaminophen (TYLENOL) tablet 650 mg  650 mg Oral Q6H PRN Benjamine Mola, FNP      . alum & mag hydroxide-simeth (MAALOX/MYLANTA) 200-200-20 MG/5ML suspension 30 mL  30 mL Oral Q4H PRN Benjamine Mola, FNP      . chlordiazePOXIDE (LIBRIUM) capsule 25 mg  25 mg Oral Q6H PRN Laverle Hobby, PA-C      . chlordiazePOXIDE (LIBRIUM) capsule 25 mg  25 mg Oral QID Laverle Hobby, PA-C   25 mg at 06/03/13 0810   Followed by  . [START ON 06/04/2013] chlordiazePOXIDE (LIBRIUM) capsule 25 mg  25 mg Oral TID Laverle Hobby, PA-C       Followed by  . [START ON 06/05/2013] chlordiazePOXIDE (LIBRIUM) capsule 25 mg  25 mg Oral BH-qamhs Spencer E Simon, PA-C       Followed by  . [START ON 06/07/2013] chlordiazePOXIDE (LIBRIUM) capsule 25 mg  25 mg Oral Daily Laverle Hobby, PA-C      . hydrOXYzine (ATARAX/VISTARIL) tablet 25 mg  25 mg Oral Q6H PRN Laverle Hobby, PA-C      . loperamide (IMODIUM) capsule 2-4 mg  2-4 mg Oral PRN Laverle Hobby, PA-C      . magnesium hydroxide (MILK OF MAGNESIA) suspension 30 mL  30 mL Oral Daily PRN Benjamine Mola, FNP      . multivitamin with minerals tablet 1 tablet  1 tablet Oral Daily Laverle Hobby, PA-C   1 tablet at 06/03/13 0810  . ondansetron (ZOFRAN-ODT) disintegrating tablet 4 mg  4 mg Oral Q6H PRN Laverle Hobby, PA-C      . thiamine (B-1) injection 100 mg  100 mg Intramuscular Once [employer], PA-C      . thiamine (VITAMIN B-1) tablet 100 mg  100 mg Oral Daily Laverle Hobby, PA-C   100 mg at 06/03/13 0810  . traZODone  (DESYREL) tablet 50 mg  50 mg Oral QHS,MR X 1 Spencer E Simon, PA-C   50 mg at 06/02/13 2200    Observation Level/Precautions:  15 minute checks  Laboratory:  As per the ED  Psychotherapy:  Individual/group  Medications:  Librium detox/reassessed and address the co morbidities  Consultations:    Discharge Concerns:    Estimated LOS: 3-5 days  Other:     I certify that inpatient services furnished can reasonably be expected to improve the patient's condition.   Nicholaus Bloom 6/3/20159:18 AM

## 2013-06-03 NOTE — Tx Team (Signed)
Interdisciplinary Treatment Plan Update (Adult)  Date: 06/03/2013   Time Reviewed: 11:17 AM  Progress in Treatment:  Attending groups:yes  Participating in groups:  Yes  Taking medication as prescribed: Yes  Tolerating medication: Yes  Family/Significant othe contact made: No. SPE not required for this pt.   Patient understands diagnosis: Yes, AEB seeking treatment for ETOH detox and mood stabilization.  Discussing patient identified problems/goals with staff: Yes  Medical problems stabilized or resolved: Yes  Denies suicidal/homicidal ideation: Yes during admission, group, and self report.  Patient has not harmed self or Others: Yes  New problem(s) identified: Pt speaks minimal English. RN Maureen Ralphs) requesting translator for assistance. CSW made request this morning.  Discharge Plan or Barriers: Pt plans to return home and follow up at [facility]. He is ready to return to work and was hoping for d/c today. Per MD, likely d/c tomorrow.  Additional comments:  38 year old Latino male admitted for alcohol detox. Patient states he drinks 12-24 beers every 2-3 days. States he only smokes cigarettes when he is drinking. 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 States his boss is supportive. He speaks minimal [place].  Admission completed to the best of my ability. He was introduced to Dr. Dub Mikes in order to build his comfort level. Patient was oriented to the unit and escorted to the dining room for dinner. Patient was very pleasant and cooperative with the admission process. He was able to work through the admission with my minimal Spanish and his minimal [place]. He was able to verbalize that he wants to stop drinking alcohol completely. States "no more."  Reason for Continuation of Hospitalization: Librium taper-withdrawals  Mood stabilization Medication management  Estimated length of stay: 1 day (likely d/c Thursday) For review of initial/current patient goals, please  see plan of care.  Attendees:  Patient:    Family:    Physician: Geoffery Lyons MD 06/03/2013 11:17 AM   Nursing: Lupita Leash RN  06/03/2013 11:17 AM   Clinical Social Worker Jaymie Mckiddy Smart, LCSWA  06/03/2013 11:17 AM   Other: Maureen Ralphs RN 06/03/2013 11:17 AM   Other: Chandra Batch. PA 06/03/2013 11:17 AM   Other: Darden Dates Nurse CM  06/03/2013 11:17 AM   Other:    Scribe for Treatment Team:  [employer] LCSWA 06/03/2013 11:17 AM

## 2013-06-03 NOTE — Discharge Planning (Signed)
[facility] not able to see patient, [facility] orange card information and resources will be sent to the address listed. ° °

## 2013-06-03 NOTE — Progress Notes (Signed)
Pt observed in his room, in bed, awake.  Conversation was limited with pt as he speaks limited [place].  He denies pain.  No SI/HI/AV.  Pt seems restless, but has no tremors.  He has no nausea at this time.  Meds given as ordered.  Will inform morning staff that pt needs an interpreter for groups.  Support offered.  Safety maintained with q15 minute checks.

## 2013-06-03 NOTE — Progress Notes (Signed)
Through an interpreter, pt reports he is having minimal withdrawal symptoms at this time.  He denies SI/HI/AV.  He wants to discharge soon so he can get back to work.  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  CSW provided pt with some info today to help him get another place to stay.  Pt denies any pain and voices no needs or concerns at this time.  Support and encouragement offered.  Safety maintained with q15 minute checks.

## 2013-06-03 NOTE — BHH Group Notes (Signed)
[facility] LCSW Aftercare Discharge Planning Group Note   06/03/2013 10:06 AM  Participation Quality:  Appropriate   Mood/Affect:  Appropriate  Depression Rating:  1  Anxiety Rating:  1  Thoughts of Suicide:  No Will you contract for safety?   NA  Current AVH:  No  Plan for Discharge/Comments:  Pt reports that he wants to d/c asap. 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 Likely d/c Thursday per Dr. Dub Mikes. Maureen Ralphs RN requesting interpretor. CSW ordered.   Transportation Means: boss/bus pass  Supports: friends/coworkers/boss  Teaching laboratory technician

## 2013-06-03 NOTE — [facility]HH Suicide Risk Assessment (Signed)
Suicide Risk Assessment  Admission Assessment     Nursing information obtained from:  Patient Demographic factors:  Male Current Mental Status:  NA Loss Factors:  NA Historical Factors:  NA Risk Reduction Factors:  Religious beliefs about death;Employed Total Time spent with patient: 45 minutes  CLINICAL FACTORS:   Alcohol/Substance Abuse/Dependencies  PCOGNITIVE FEATURES THAT CONTRIBUTE TO RISK:  Closed-mindedness Polarized thinking Thought constriction (tunnel vision)    SUICIDE RISK:   Moderate:   PLAN OF CARE: Supportive approach/coping skills/relapse prevention                               Librium detox protocol                               Reassess and address the co morbidities  I certify that inpatient services furnished can reasonably be expected to improve the patient's condition.  Rachael Fee 06/03/2013, 2:02 PM

## 2013-06-03 NOTE — Clinical Social Work Note (Signed)
Pt speaks minimal English. Maureen Ralphs RN requesting interpretor services. Request for interpretor services emailed today 06/03/13 by CSW. Pt has tentative d/c date of Thursday, 06/04/13.  [employer], LCSWA 06/03/2013 11:24 AM

## 2013-06-03 NOTE — Progress Notes (Signed)
Adult Psychoeducational Group Note  Date:  06/03/2013 Time:  11:18 PM  Group Topic/Focus:  Wrap-Up Group:   The focus of this group is to help patients review their daily goal of treatment and discuss progress on daily workbooks.  Participation Level:  Minimal  Participation Quality:  Appropriate  Affect:  Appropriate  Cognitive:  Alert  Insight: Appropriate  Engagement in Group:  Limited  Modes of Intervention:  Discussion  Additional Comments:  Through the help of the interpreter, pt stated that he had a good day and knows that he needs to stop drinking.   Flonnie Hailstone 06/03/2013, 11:18 PM

## 2013-06-03 NOTE — Progress Notes (Addendum)
Pt was able to take his am medications today without incident.  Spoke with him and an interpretor after lunch.  Pt denies any depression, hopelessness or anxiety on his self-inventory. He has been drinking for past 25 years and he is starting to have a lot of stomach issues when he drinks mainly nausea and vomiting.  He denies any symptoms of withdrawal except for a mild tremor.  He is unsure of his follow up from here.  The interpretor Delorise Royals gave the patient information of a sober living place for mainly Hispanics.  Alecia Lemming is the contact person [phone] available on Monday, Wednesday and Friday from 7:00 pm to 1000 pm the address is [address] [place]. Gave information to Dr. Dub Mikes too.  Pt denied any S/H ideation or A/V/H.

## 2013-06-04 DIAGNOSIS — F1020 Alcohol dependence, uncomplicated: Principal | ICD-10-CM

## 2013-06-04 MED ORDER — TRAZODONE HCL 50 MG PO TABS: 50.0000 mg | ORAL_TABLET | Freq: Every evening | ORAL | Status: DC | PRN

## 2013-06-04 NOTE — Discharge Summary (Signed)
Physician Discharge Summary Note  Patient:  Perry Mercado is an 38 y.o., male MRN:  [id number] DOB:  11/22/1976 Patient phone:  [phone] (home)  Patient address:   [place] [address],  Total Time spent with patient: Greater than 30 minutes  Date of Admission:  06/02/2013 Date of Discharge: 06/04/13  Reason for Admission: Alcohol dependence  Discharge Diagnoses: Active Problems:   Alcohol dependence   Psychiatric Specialty Exam: Physical Exam  Psychiatric: His speech is normal and behavior is normal. Judgment and thought content normal. His mood appears not anxious. His affect is not angry, not blunt, not labile and not inappropriate. Cognition and memory are normal. He does not exhibit a depressed mood.    Review of Systems  Constitutional: Negative.   HENT: Negative.   Eyes: Negative.   Respiratory: Negative.   Cardiovascular: Negative.   Gastrointestinal: Negative.   Genitourinary: Negative.   Musculoskeletal: Negative.   Skin: Negative.   Neurological: Negative.   Endo/Heme/Allergies: Negative.   Psychiatric/Behavioral: Positive for substance abuse (Alcoholism). Negative for depression, suicidal ideas, hallucinations and memory loss. The patient has insomnia (Stable). The patient is not nervous/anxious.     Blood pressure 115/75, pulse 99, temperature 98 F (36.7 C), temperature source Oral, resp. rate 16, height _0  (2.032 m), weight 79.833 kg (176 lb).Body mass index is 19.33 kg/(m^2).   General Appearance: Fairly Groomed  Engineer, water:: Fair  Speech: Clear and Coherent  Volume: Normal  Mood: anxious, "ready to go back to work"  Affect: Appropriate  Thought Process: Coherent and Goal Directed  Orientation: Full (Time, Place, and Person)  Thought Content: plans as he moves on, relapse prevention  Suicidal Thoughts: No  Homicidal Thoughts: No Memory: Immediate; Fair  Recent; Fair  Remote; Fair  Judgement: Fair  Insight: Present   Psychomotor Activity: Normal  Concentration: Fair  Recall: [employer] of Knowledge:NA  Language: Spanish  Akathisia: No  Handed: Right  AIMS (if indicated): Assets: Desire for Improvement  Resilience  Talents/Skills  Vocational/Educational  Sleep: Number of Hours: 6.5   Past Psychiatric History: Diagnosis: Alcohol dependence  Hospitalizations: [facility] adult unit  Outpatient Care: [facility]  Substance Abuse Care: [facility]  Self-Mutilation: NA  Suicidal Attempts: NA  Violent Behaviors: NA   Musculoskeletal: Strength & Muscle Tone: within normal limits Gait & Station: normal Patient leans: N/A  DSM5: Schizophrenia Disorders:  NA Obsessive-Compulsive Disorders:  NA Trauma-Stressor Disorders:  NA Substance/Addictive Disorders:  Alcohol Related Disorder - Severe (303.90) Depressive Disorders:  NA  Axis Diagnosis:   AXIS I:  Alcohol dependence AXIS II:  Deferred AXIS III:   Past Medical History  Diagnosis Date  . Medical history non-contributory    AXIS IV:  other psychosocial or environmental problems and Alcoholism AXIS V:  62  Level of Care:  OP  Hospital Course: 38 Y/O male who states he drinks heavily on weekends. States during the weekdays he drinks one or two on weekends can drink up to 25 beers in a day. States he has been drinking since he was 45. States the longest time abstinent has been a week, two weeks. States when he drinks he smokes marijuana. 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   Austin's stay in this hospital was rather very brief. He was admitted to the hospital with a blood alcohol level of 84 per toxicology tests and a UDS report positive for THC. He was requesting alcohol detoxification treatment. Although requesting detox treatment, Perry Mercado  was not presenting with much of any withdrawal symptoms. He was noted to be anxious and wanting to call his boss upon arrival to this hospital. He was started on Librium detox protocols to  combat any withdrawal symptoms that he may experience. He was also started on Trazodone 50 mg Q bedtime for sleep and enrolled in the group counseling sessions and AA/NA meetings being offered on this unit.  Perry Mercado came to the providers this am and asked to be discharged to his home. He says he is not having any withdrawal symptoms. He denies feeling and or being depressed. He denies any SIHI, AVH, delusional thought, paranoia and or withdrawal symptoms. He has been recommended and referred to the [facility] clinic to follow-up care. He left [facility] with all personal belongings in no apparent distress. Transportation per bus.  Consults:  psychiatry  Significant Diagnostic Studies:  labs: CBC with diff, CMP, UDS, Toxicology tests, U/A  Discharge Vitals:   Blood pressure 115/75, pulse 99, temperature 98 F (36.7 C), temperature source Oral, resp. rate 16, height _0  (2.032 m), weight 79.833 kg (176 lb). Body mass index is 19.33 kg/(m^2). Lab Results:   Results for orders placed during the hospital encounter of 06/01/13 (from the past 72 hour(s))  ACETAMINOPHEN LEVEL     Status: None   Collection Time    06/01/13 10:35 AM      Result Value Ref Range   Acetaminophen (Tylenol), Serum <15.0  10 - 30 ug/mL   Comment:            THERAPEUTIC CONCENTRATIONS VARY     SIGNIFICANTLY. A RANGE OF 10-30     ug/mL MAY BE AN EFFECTIVE     CONCENTRATION FOR MANY PATIENTS.     HOWEVER, SOME ARE BEST TREATED     AT CONCENTRATIONS OUTSIDE THIS     RANGE.     ACETAMINOPHEN CONCENTRATIONS     >150 ug/mL AT 4 HOURS AFTER     INGESTION AND >50 ug/mL AT 12     HOURS AFTER INGESTION ARE     OFTEN ASSOCIATED WITH TOXIC     REACTIONS.  CBC     Status: None   Collection Time    06/01/13 10:35 AM      Result Value Ref Range   WBC 4.3  4.0 - 10.5 K/uL   RBC 4.73  4.22 - 5.81 MIL/uL   Hemoglobin 14.8  13.0 - 17.0 g/dL   HCT 44.1  39.0 - 52.0 %   MCV 93.2  78.0 - 100.0 fL   MCH 31.3  26.0 - 34.0 pg   MCHC 33.6   30.0 - 36.0 g/dL   RDW 12.7  11.5 - 15.5 %   Platelets 179  150 - 400 K/uL  COMPREHENSIVE METABOLIC PANEL     Status: None   Collection Time    06/01/13 10:35 AM      Result Value Ref Range   Sodium 144  137 - 147 mEq/L   Potassium 4.5  3.7 - 5.3 mEq/L   Chloride 107  96 - 112 mEq/L   CO2 22  19 - 32 mEq/L   Glucose, Bld 93  70 - 99 mg/dL   BUN 8  6 - 23 mg/dL   Creatinine, Ser 0.72  0.50 - 1.35 mg/dL   Calcium 9.5  8.4 - 10.5 mg/dL   Total Protein 7.3  6.0 - 8.3 g/dL   Albumin 4.2  3.5 - 5.2 g/dL   AST  28  0 - 37 U/L   ALT 25  0 - 53 U/L   Alkaline Phosphatase 76  39 - 117 U/L   Total Bilirubin 0.4  0.3 - 1.2 mg/dL   GFR calc non Af Amer >90  >90 mL/min   GFR calc Af Amer >90  >90 mL/min   Comment: (NOTE)     The eGFR has been calculated using the CKD EPI equation.     This calculation has not been validated in all clinical situations.     eGFR's persistently <90 mL/min signify possible Chronic Kidney     Disease.  ETHANOL     Status: Abnormal   Collection Time    06/01/13 10:35 AM      Result Value Ref Range   Alcohol, Ethyl (B) 84 (*) 0 - 11 mg/dL   Comment:            LOWEST DETECTABLE LIMIT FOR     SERUM ALCOHOL IS 11 mg/dL     FOR MEDICAL PURPOSES ONLY  SALICYLATE LEVEL     Status: Abnormal   Collection Time    06/01/13 10:35 AM      Result Value Ref Range   Salicylate Lvl <0.1 (*) 2.8 - 20.0 mg/dL  URINE RAPID DRUG SCREEN (HOSP PERFORMED)     Status: Abnormal   Collection Time    06/01/13 10:44 AM      Result Value Ref Range   Opiates NONE DETECTED  NONE DETECTED   Cocaine NONE DETECTED  NONE DETECTED   Benzodiazepines NONE DETECTED  NONE DETECTED   Amphetamines NONE DETECTED  NONE DETECTED   Tetrahydrocannabinol POSITIVE (*) NONE DETECTED   Barbiturates NONE DETECTED  NONE DETECTED   Comment:            DRUG SCREEN FOR MEDICAL PURPOSES     ONLY.  IF CONFIRMATION IS NEEDED     FOR ANY PURPOSE, NOTIFY LAB     WITHIN 5 DAYS.                LOWEST  DETECTABLE LIMITS     FOR URINE DRUG SCREEN     Drug Class       Cutoff (ng/mL)     Amphetamine      1000     Barbiturate      200     Benzodiazepine   601     Tricyclics       093     Opiates          300     Cocaine          300     THC              50   Physical Findings: AIMS: Facial and Oral Movements Muscles of Facial Expression: None, normal Lips and Perioral Area: None, normal Jaw: None, normal Tongue: None, normal,Extremity Movements Upper (arms, wrists, hands, fingers): None, normal Lower (legs, knees, ankles, toes): None, normal, Trunk Movements Neck, shoulders, hips: None, normal, Overall Severity Severity of abnormal movements (highest score from questions above): None, normal Incapacitation due to abnormal movements: None, normal Patient's awareness of abnormal movements (rate only patient's report): No Awareness, Dental Status Current problems with teeth and/or dentures?: No Does patient usually wear dentures?: No  CIWA:  CIWA-Ar Total: 1 COWS:     Psychiatric Specialty Exam: See Psychiatric Specialty Exam and Suicide Risk Assessment completed by Attending Physician prior to discharge.  Discharge destination:  Home  Is patient on multiple antipsychotic therapies at discharge:  No   Has Patient had three or more failed trials of antipsychotic monotherapy by history:  No  Recommended Plan for Multiple Antipsychotic Therapies: NA    Medication List       Indication   traZODone 50 MG tablet  Commonly known as:  DESYREL  Take 1 tablet (50 mg total) by mouth at bedtime and may repeat dose one time if needed. For Sleep   Indication:  Trouble Sleeping       Follow-up Information   Follow up with [facility]. (Walk in between 8am-9am Monday through Friday for hospital follow-up/medication management/assessment for therapy services. )    Contact information:   [address] [place] [address] Phone: [phone] Fax: [phone]     Follow-up  recommendations:  Activity:  As tolerated Diet: As recommended by your primary care doctor. Keep all scheduled follow-up appointments as recommended.  Comments: Take all your medications as prescribed by your mental healthcare provider. Report any adverse effects and or reactions from your medicines to your outpatient provider promptly. Patient is instructed and cautioned to not engage in alcohol and or illegal drug use while on prescription medicines. In the event of worsening symptoms, patient is instructed to call the crisis hotline, 911 and or go to the nearest ED for appropriate evaluation and treatment of symptoms. Follow-up with your primary care provider for your other medical issues, concerns and or health care needs.    Total Discharge Time:  Greater than 30 minutes.  Signed: Encarnacion Slates, PMHNP, FNP-BC 06/04/2013, 9:50 AM  I personally assessed the patient and formulated the plan Geralyn Flash A. Sabra Heck, M.D.

## 2013-06-04 NOTE — Progress Notes (Signed)
Patient ID: Perry Mercado, male   DOB: 11/22/1976, 38 y.o.   MRN: [id number] He has been discharged home and was given a bus pass home. Discharge instruction were explained to him by Dr. Dub Mikes and Nam voiced that he understood discharge instruction and the follow up plan. He denies thoughts of SI and all his belongings were taken home with him.

## 2013-06-04 NOTE — BHH Counselor (Signed)
Pt d/ced in under 48 hours. No SPE required.  [employer], LCSWA 06/04/2013 9:46 AM

## 2013-06-04 NOTE — Progress Notes (Signed)
[facility] Adult Case Management Discharge Plan :  Will you be returning to the same living situation after discharge: Yes,  home until he finds a place of his own. At discharge, do you have transportation home?:Yes,  boss coming at 1:30PM  Do you have the ability to pay for your medications:Yes,  mental health  Release of information consent forms completed and submitted to Medical Records by CSW.   Patient to Follow up at: Follow-up Information   Follow up with [facility]. (Walk in between 8am-9am Monday through Friday for hospital follow-up/medication management/assessment for therapy services. )    Contact information:   [address] [place], [place] [address] Phone: [phone] Fax: [phone]      Patient denies SI/HI:   Yes,  during admission, group, and self report.     Safety Planning and Suicide Prevention discussed:  Yes,  SPE not required for this pt. SPI pamphlet provided to pt and he was encouraged to share information with support network, ask questions, and talk about any concerns relating to SPE.  Deshaun Weisinger Smart LCSWA  06/04/2013, 9:46 AM

## 2013-06-04 NOTE — Progress Notes (Signed)
Patient ID: Perry Mercado, male   DOB: 11/22/1976, 38 y.o.   MRN: [id number] He has been up and to groups has little interaction  With peers and staff d/t not speaking english. Interrupter has been  Self inventory not done.

## 2013-06-04 NOTE — Clinical Social Work Note (Signed)
Per MD, CSW called pt's employer, [employer] to explain detox procedure and typical length of stay. CSW also reviewed pt's aftercare plan and employer was satisfied with this and open to pt d/cing today. He stated that he could not pick up pt and pt would need a bus pass. CSW notified RN Lupita Leash) and put bus pass in chart.  [employer], LCSWA  06/04/2013 12:36 PM

## 2013-06-04 NOTE — BHH Suicide Risk Assessment (Signed)
Suicide Risk Assessment  Discharge Assessment     Demographic Factors:  Male  Total Time spent with patient: 45 minutes  Psychiatric Specialty Exam:     Blood pressure 115/75, pulse 99, temperature 98 F (36.7 C), temperature source Oral, resp. rate 16, height 6\' 8"  (2.032 m), weight 79.833 kg (176 lb).Body mass index is 19.33 kg/(m^2).  General Appearance: Fairly Groomed  Patent attorney::  Fair  Speech:  Clear and Coherent  Volume:  Normal  Mood:  anxious, "ready to go back to work"  Affect:  Appropriate  Thought Process:  Coherent and Goal Directed  Orientation:  Full (Time, Place, and Person)  Thought Content:  plans as he moves on, relapse prevention  Suicidal Thoughts:  No  Homicidal Thoughts:  No  Memory:  Immediate;   Fair Recent;   Fair Remote;   Fair  Judgement:  Fair  Insight:  Present  Psychomotor Activity:  Normal  Concentration:  Fair  Recall:  [employer] of Knowledge:NA  Language: Spanish  Akathisia:  No  Handed:  Right  AIMS (if indicated):     Assets:  Desire for Improvement Resilience Talents/Skills Vocational/Educational  Sleep:  Number of Hours: 6.5    Musculoskeletal: Strength & Muscle Tone: within normal limits Gait & Station: normal Patient leans: N/A   Mental Status Per Nursing Assessment::   On Admission:  NA  Current Mental Status by Physician: In full contact with reality. There are no active S/S of withdrawal. Wants to be D/C today. His employer will find him a place. He plans to be at work tomorrow. Committed to abstinence   Loss Factors: NA  Historical Factors: NA  Risk Reduction Factors:   Employed and Positive social support  Continued Clinical Symptoms:  Alcohol/Substance Abuse/Dependencies  Cognitive Features That Contribute To Risk:  Closed-mindedness Polarized thinking Thought constriction (tunnel vision)    Suicide Risk:  Minimal: No identifiable suicidal ideation.  Patients presenting with no risk factors but  with morbid ruminations; may be classified as minimal risk based on the severity of the depressive symptoms  Discharge Diagnoses:   AXIS I:  Alcohol Dependence, S/P alcohol withdrawal AXIS II:  No diagnosis AXIS III:   Past Medical History  Diagnosis Date  . Medical history non-contributory    AXIS IV:  other psychosocial or environmental problems AXIS V:  61-70 mild symptoms  Plan Of Care/Follow-up recommendations:  Activity:  as tolerated Diet:  regular Follow up outpatient basis Is patient on multiple antipsychotic therapies at discharge:  No   Has Patient had three or more failed trials of antipsychotic monotherapy by history:  No  Recommended Plan for Multiple Antipsychotic Therapies: NA    Rachael Fee 06/04/2013, 11:17 AM

## 2013-06-08 NOTE — Progress Notes (Signed)
Patient Discharge Instructions:  After Visit Summary (AVS):   Faxed to:  06/08/13 Discharge Summary Note:   Faxed to:  06/08/13 Psychiatric Admission Assessment Note:   Faxed to:  06/08/13 Suicide Risk Assessment - Discharge Assessment:   Faxed to:  06/08/13 Faxed/Sent to the Next Level Care provider:  06/08/13 Faxed to [facility] @ [phone]  Jerelene Redden, 06/08/2013, 3:55 PM

## 2014-05-10 ENCOUNTER — Emergency Department (HOSPITAL_COMMUNITY): Payer: Self-pay

## 2014-05-10 ENCOUNTER — Encounter (HOSPITAL_COMMUNITY): Payer: Self-pay | Admitting: Emergency Medicine

## 2014-05-10 ENCOUNTER — Emergency Department (HOSPITAL_COMMUNITY)
Admission: EM | Admit: 2014-05-10 | Discharge: 2014-05-10 | Disposition: A | Discharge status: Home / Self Care | Payer: Self-pay | Attending: Emergency Medicine | Admitting: Emergency Medicine

## 2014-05-10 DIAGNOSIS — Y9289 Other specified places as the place of occurrence of the external cause: Secondary | ICD-10-CM | POA: Insufficient documentation

## 2014-05-10 DIAGNOSIS — S82892A Other fracture of left lower leg, initial encounter for closed fracture: Secondary | ICD-10-CM

## 2014-05-10 DIAGNOSIS — Y998 Other external cause status: Secondary | ICD-10-CM | POA: Insufficient documentation

## 2014-05-10 DIAGNOSIS — S0083XA Contusion of other part of head, initial encounter: Secondary | ICD-10-CM

## 2014-05-10 DIAGNOSIS — Z720 Tobacco use: Secondary | ICD-10-CM | POA: Insufficient documentation

## 2014-05-10 DIAGNOSIS — Y9389 Activity, other specified: Secondary | ICD-10-CM | POA: Insufficient documentation

## 2014-05-10 DIAGNOSIS — S8265XA Nondisplaced fracture of lateral malleolus of left fibula, initial encounter for closed fracture: Secondary | ICD-10-CM | POA: Insufficient documentation

## 2014-05-10 DIAGNOSIS — S0011XA Contusion of right eyelid and periocular area, initial encounter: Secondary | ICD-10-CM | POA: Insufficient documentation

## 2014-05-10 MED ORDER — MORPHINE SULFATE 4 MG/ML IJ SOLN
4.0000 mg | Freq: Once | INTRAMUSCULAR | Status: AC
  Administered 2014-05-10: 4 mg via INTRAMUSCULAR
  Filled 2014-05-10: qty 1

## 2014-05-10 MED ORDER — OXYCODONE-ACETAMINOPHEN 5-325 MG PO TABS: 2.0000 | ORAL_TABLET | ORAL | Status: DC | PRN

## 2014-05-10 MED ORDER — MORPHINE SULFATE 4 MG/ML IJ SOLN: 4.0000 mg | Freq: Once | INTRAMUSCULAR | Status: DC

## 2014-05-10 NOTE — ED Notes (Signed)
Pt c/o edema to left foot onset yesterday, pt states loss of sensation to foot. Pulses present, motor function intact.

## 2014-05-10 NOTE — Discharge Instructions (Signed)
Fractura de tobillo °(Ankle Fracture) °Una fractura es la ruptura de un hueso. La articulación del tobillo está compuesta por tres huesos. Estos incluyen las secciones inferiores (distales) de los huesos de la extremidad inferior, llamados tibia y peroné, junto con un hueso del pie, llamado astrágalo. En función de la gravedad de la fractura y si hay más de un hueso de la articulación del tobillo fracturado, se usa un yeso o una férula para proteger el hueso fracturado e impedir que este se mueva mientras se suelda. A veces, es necesario realizar la cirugía para ayudar a que la fractura suelde correctamente.  °Hay dos tipos generales de fracturas: °· Fractura estable. En este tipo de fractura hay una sola línea de la fractura que atraviesa un hueso sin lesiones en los ligamentos del tobillo. La fractura del astrágalo sin desplazamiento (movimiento del hueso hacia uno de los lados de la línea de la fractura) también es estable. °· Fractura inestable. En este tipo de fractura hay más de una línea de la fractura que atraviesan uno o más huesos de la articulación del tobillo. También incluye las fracturas con desplazamiento del hueso hacia uno de los lados de la línea de la fractura. °CAUSAS °· Un golpe directo en el tobillo. °· Una torcedura rápida y grave del tobillo. °· Un traumatismo, como un accidente automovilístico o una caída de una altura importante. °FACTORES DE RIESGO °Puede tener un riesgo más alto de sufrir una fractura de tobillo si: °· Tiene ciertas enfermedades crónicas. °· Practica deportes de alto impacto. °· Tiene un accidente automovilístico de alto impacto. °SIGNOS Y SÍNTOMAS  °· Dolor e hinchazón en el tobillo. °· Hematomas alrededor del tobillo lesionado. °· Dolor al mover el tobillo. °· Dificultad para caminar o para soportar peso en el tobillo. °· Pie frío por debajo del lugar de la lesión del tobillo. Esto puede ocurrir si los vasos sanguíneos que atraviesan el tobillo lesionado también se  dañaron. °· Adormecimiento del pie por debajo del lugar de la lesión del tobillo. °DIAGNÓSTICO  °Generalmente, la fractura de tobillo se diagnostica mediante un examen físico y radiografías. También puede ser necesario realizar una tomografía computarizada si la fractura es compleja. °TRATAMIENTO  °Para tratar las fracturas estables, se coloca un yeso o una férula y se usan muletas para no recargar el tobillo lesionado. A esto le sigue un programa de fortalecimiento del tobillo. Algunos pacientes necesitan un tipo especial de yeso, en función de otros problemas médicos que pueden tener. Las fracturas inestables requieren cirugía para asegurarse de que los huesos se suelden correctamente. El médico le informará qué tipo de fractura tiene y cuál es el mejor tratamiento para su afección. °INSTRUCCIONES PARA EL CUIDADO EN EL HOGAR  °· Revise con el médico cuál es la mejor forma de usar las muletas y úselas como se lo indiquen. El uso seguro de las muletas es muy importante. El uso indebido de las muletas puede provocarle caídas o causar lesiones en los nervios de las manos o las axilas. °· No recargue ni ejerza presión en el tobillo lesionado hasta tanto el médico se lo indique. °· Para disminuir la hinchazón, mantenga elevada la pierna lesionada mientras está sentado o acostado. °· Aplique hielo sobre la zona lesionada. °¨ Ponga el hielo en una bolsa plástica. °¨ Coloque una toalla entre el yeso y la bolsa de hielo. °¨ Deje el hielo durante 20 minutos, 2 a 3 veces por día. °· Si le colocaron un yeso o un molde de fibra de vidrio: °¨   No trate de rascarse la piel por debajo del yeso con ningún objeto. Esto puede aumentar el riesgo de infecciones cutáneas. °¨ Controle todos los días la piel de alrededor del yeso. Puede colocarse una loción en las zonas rojas o doloridas. °¨ Mantenga el yeso seco y limpio. °· Si tiene una férula de yeso: °¨ Se la férula del modo en que se lo indicaron. °¨ Puede aflojar el elástico que  rodea la férula si los dedos se entumecen, siente hormigueos, se enfrían o se vuelven de color azul. °· No ejerza presión en ninguna parte del yeso o férula; podría romperse. Durante las primeras 24 horas mantenga el yeso sobre una almohada hasta que esté completamente duro. °· Es posible proteger el yeso o la férula durante el baño con una bolsa de plástico sellada sobre la piel con cinta adhesiva. No los sumerja en el agua. °· Tome todos los medicamentos como le indicó el médico. Utilice los medicamentos de venta libre o recetados para calmar el dolor, el malestar o la fiebre, según se lo indique el médico. °· No conduzca vehículos hasta que el médico le diga específicamente que puede hacerlo con seguridad. °· Si el médico le ha dado fecha para una visita de control, es importante que concurra. No concurrir a la visita puede derivar en que el daño, el dolor o la discapacidad sean permanentes o crónicos. Si tiene problemas para cumplir con la visita, llame al centro para pedir ayuda. °SOLICITE ATENCIÓN MÉDICA SI: °Aumenta la hinchazón o la molestia. °SOLICITE ATENCIÓN MÉDICA DE INMEDIATO SI:  °· Su yeso se daña o se rompe. °· Tiene dolor intenso y continuo. °· Siente un nuevo dolor o presenta hinchazón después de la colocación del yeso. °· La piel o las uñas del pie que están por debajo de la lesión se le ponen azules o grises. °· La piel o las uñas del pie que están por debajo de la lesión están frías, adormecidas o pierde la sensibilidad al tacto. °· Siente mal olor u observa secreción debajo del yeso. °ASEGÚRESE DE QUE:  °· Comprende estas instrucciones. °· Controlará su afección. °· Recibirá ayuda de inmediato si no mejora o si empeora. °Document Released: 12/18/2004 Document Revised: 12/23/2012 °Exit[employer]® Patient Information ©2015 [employer]. This information is not intended to replace advice given to you by your health care provider. Make sure you discuss any questions you have with your health care  provider. ° °

## 2014-05-10 NOTE — ED Notes (Signed)
Patient transported to X-ray 
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
## 2016-01-20 ENCOUNTER — Encounter (HOSPITAL_BASED_OUTPATIENT_CLINIC_OR_DEPARTMENT_OTHER): Payer: Self-pay

## 2016-01-20 ENCOUNTER — Emergency Department (HOSPITAL_BASED_OUTPATIENT_CLINIC_OR_DEPARTMENT_OTHER)
Admission: EM | Admit: 2016-01-20 | Discharge: 2016-01-21 | Disposition: A | Discharge status: Home / Self Care | Payer: Self-pay | Attending: Emergency Medicine | Admitting: Emergency Medicine

## 2016-01-20 DIAGNOSIS — K2920 Alcoholic gastritis without bleeding: Secondary | ICD-10-CM | POA: Insufficient documentation

## 2016-01-20 DIAGNOSIS — F17210 Nicotine dependence, cigarettes, uncomplicated: Secondary | ICD-10-CM | POA: Insufficient documentation
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
## 2016-01-21 ENCOUNTER — Emergency Department (HOSPITAL_BASED_OUTPATIENT_CLINIC_OR_DEPARTMENT_OTHER): Payer: Self-pay

## 2016-01-21 MED ORDER — ONDANSETRON 8 MG PO TBDP
8.0000 mg | ORAL_TABLET | Freq: Once | ORAL | Status: AC
  Administered 2016-01-21: 8 mg via ORAL
  Filled 2016-01-21: qty 1

## 2016-01-21 MED ORDER — RANITIDINE HCL 150 MG PO CAPS: 150.0000 mg | ORAL_CAPSULE | Freq: Every day | ORAL | 0 refills | Status: DC

## 2016-01-21 MED ORDER — GI COCKTAIL ~~LOC~~
30.0000 mL | Freq: Once | ORAL | Status: AC
  Administered 2016-01-21: 30 mL via ORAL
  Filled 2016-01-21: qty 30

## 2016-01-21 MED ORDER — DICYCLOMINE HCL 10 MG PO CAPS
10.0000 mg | ORAL_CAPSULE | Freq: Once | ORAL | Status: AC
  Administered 2016-01-21: 10 mg via ORAL
  Filled 2016-01-21: qty 1

## 2016-01-21 NOTE — ED Notes (Signed)
Returned from CT.

## 2016-01-21 NOTE — ED Provider Notes (Signed)
[facility]   CSN: [id number] Arrival date & time: 01/20/16  2155     History   Chief Complaint Chief Complaint  Patient presents with  . Abdominal Pain    HPI Perry Mercado is a 41 y.o. male.  The history is provided by the patient. A language interpreter was used.  Abdominal Pain   This is a new problem. The current episode started 1 to 2 hours ago. The problem occurs constantly. The problem has not changed since onset.The pain is associated with alcohol use. The pain is located in the LUQ. The pain is moderate. Pertinent negatives include anorexia, fever, diarrhea, nausea and constipation. Nothing aggravates the symptoms. Nothing relieves the symptoms. Past workup does not include GI consult. His past medical history does not include PUD.    Past Medical History:  Diagnosis Date  . Medical history non-contributory     Patient Active Problem List   Diagnosis Date Noted  . Alcohol dependence (HCC) 06/03/2013    Past Surgical History:  Procedure Laterality Date  . NO PAST SURGERIES         Home Medications    Prior to Admission medications   Not on File    Family History No family history on file.  Social History Social History  Substance Use Topics  . Smoking status: Current Every Day Smoker    Packs/day: 2.00    Types: Cigarettes  . Smokeless tobacco: Never Used  . Alcohol use Yes     Comment: weekly/ binge     Allergies   Patient has no known allergies.   Review of Systems Review of Systems  Constitutional: Negative for fever.  Respiratory: Negative for shortness of breath.   Cardiovascular: Negative for chest pain.  Gastrointestinal: Positive for abdominal pain. Negative for abdominal distention, anorexia, blood in stool, constipation, diarrhea, nausea and rectal pain.  All other systems reviewed and are negative.    Physical Exam Updated Vital Signs BP 129/80 (BP Location: Right Arm)   Pulse 68   Temp 98.3  F (36.8 C) (Oral)   Resp 16   SpO2 99%   Physical Exam  Constitutional: He is oriented to person, place, and time. He appears well-developed and well-nourished. No distress.  HENT:  Head: Normocephalic and atraumatic.  Nose: Nose normal.  Mouth/Throat: No oropharyngeal exudate.  Eyes: EOM are normal. Pupils are equal, round, and reactive to light.  Neck: Normal range of motion. Neck supple.  Cardiovascular: Normal rate, regular rhythm and intact distal pulses.   Pulmonary/Chest: Effort normal and breath sounds normal. He has no wheezes. He has no rales.  Abdominal: Soft. Bowel sounds are normal. He exhibits no mass. There is no tenderness. There is no rebound and no guarding.  Musculoskeletal: Normal range of motion.  Neurological: He is alert and oriented to person, place, and time.  Skin: Skin is warm and dry. Capillary refill takes less than 2 seconds.  Psychiatric: He has a normal mood and affect.     ED Treatments / Results   Vitals:   01/20/16 2356 01/21/16 0304  BP: 129/80 124/81  Pulse: 68 61  Resp: 16 18  Temp: 98.3 F (36.8 C) 97.8 F (36.6 C)    Radiology Results for orders placed or performed during the hospital encounter of 06/01/13  Acetaminophen level  Result Value Ref Range   Acetaminophen (Tylenol), Serum <15.0 10 - 30 ug/mL  CBC  Result Value Ref Range   WBC 4.3 4.0 - 10.5  K/uL   RBC 4.73 4.22 - 5.81 MIL/uL   Hemoglobin 14.8 13.0 - 17.0 g/dL   HCT 16.1 09.6 - 04.5 %   MCV 93.2 78.0 - 100.0 fL   MCH 31.3 26.0 - 34.0 pg   MCHC 33.6 30.0 - 36.0 g/dL   RDW 40.9 81.1 - 91.4 %   Platelets 179 150 - 400 K/uL  Comprehensive metabolic panel  Result Value Ref Range   Sodium 144 137 - 147 mEq/L   Potassium 4.5 3.7 - 5.3 mEq/L   Chloride 107 96 - 112 mEq/L   CO2 22 19 - 32 mEq/L   Glucose, Bld 93 70 - 99 mg/dL   BUN 8 6 - 23 mg/dL   Creatinine, Ser 7.82 0.50 - 1.35 mg/dL   Calcium 9.5 8.4 - 95.6 mg/dL   Total Protein 7.3 6.0 - 8.3 g/dL   Albumin  4.2 3.5 - 5.2 g/dL   AST 28 0 - 37 U/L   ALT 25 0 - 53 U/L   Alkaline Phosphatase 76 39 - 117 U/L   Total Bilirubin 0.4 0.3 - 1.2 mg/dL   GFR calc non Af Amer >90 >90 mL/min   GFR calc Af Amer >90 >90 mL/min  Ethanol (ETOH)  Result Value Ref Range   Alcohol, Ethyl (B) 84 (H) 0 - 11 mg/dL  Salicylate level  Result Value Ref Range   Salicylate Lvl <2.0 (L) 2.8 - 20.0 mg/dL  Urine Drug Screen  Result Value Ref Range   Opiates NONE DETECTED NONE DETECTED   Cocaine NONE DETECTED NONE DETECTED   Benzodiazepines NONE DETECTED NONE DETECTED   Amphetamines NONE DETECTED NONE DETECTED   Tetrahydrocannabinol POSITIVE (A) NONE DETECTED   Barbiturates NONE DETECTED NONE DETECTED   Dg Abdomen Acute W/chest  Result Date: 01/21/2016 CLINICAL DATA:  Abdominal pain EXAM: DG ABDOMEN ACUTE W/ 1V CHEST COMPARISON:  None. FINDINGS: Single-view chest demonstrates no acute infiltrate or effusion. Normal heart size. Supine and upright views of the abdomen demonstrate no free air. There is a non obstructed bowel gas pattern. Small amount of radiopaque material in the right colon. IMPRESSION: Nonobstructed bowel-gas pattern Electronically Signed   By: Jasmine Pang M.D.   On: 01/21/2016 02:00     Procedures Procedures (including critical care time)  Medications Ordered in ED Medications  ondansetron (ZOFRAN-ODT) disintegrating tablet 8 mg (not administered)  gi cocktail (Maalox,Lidocaine,Donnatal) (not administered)  dicyclomine (BENTYL) capsule 10 mg (not administered)     Final Clinical Impressions(s) / ED Diagnoses  Well appearing. No indication for labs situation is clearlyu alcoholic gastritis. Smiling PO challenged successfully.  All questions answered to patient's satisfaction. Based on history and exam patient has been appropriately medically screened and emergency conditions excluded. Patient is stable for discharge at this time. Strict return precautions given for any further episodes,  persistent fever, weakness or any concerns.    Cy Blamer, MD 01/21/16 [phone]

## 2016-04-21 ENCOUNTER — Emergency Department (HOSPITAL_COMMUNITY)
Admission: EM | Admit: 2016-04-21 | Discharge: 2016-04-21 | Disposition: A | Discharge status: Home / Self Care | Payer: Self-pay | Attending: Emergency Medicine | Admitting: Emergency Medicine

## 2016-04-21 ENCOUNTER — Encounter (HOSPITAL_COMMUNITY): Payer: Self-pay | Admitting: Emergency Medicine

## 2016-04-21 DIAGNOSIS — Z23 Encounter for immunization: Secondary | ICD-10-CM | POA: Insufficient documentation

## 2016-04-21 DIAGNOSIS — Y939 Activity, unspecified: Secondary | ICD-10-CM | POA: Insufficient documentation

## 2016-04-21 DIAGNOSIS — Y990 Civilian activity done for income or pay: Secondary | ICD-10-CM | POA: Insufficient documentation

## 2016-04-21 DIAGNOSIS — S0502XA Injury of conjunctiva and corneal abrasion without foreign body, left eye, initial encounter: Secondary | ICD-10-CM | POA: Insufficient documentation

## 2016-04-21 DIAGNOSIS — F17210 Nicotine dependence, cigarettes, uncomplicated: Secondary | ICD-10-CM | POA: Insufficient documentation

## 2016-04-21 DIAGNOSIS — Z79899 Other long term (current) drug therapy: Secondary | ICD-10-CM | POA: Insufficient documentation

## 2016-04-21 DIAGNOSIS — Y9269 Other specified industrial and construction area as the place of occurrence of the external cause: Secondary | ICD-10-CM | POA: Insufficient documentation

## 2016-04-21 DIAGNOSIS — X58XXXA Exposure to other specified factors, initial encounter: Secondary | ICD-10-CM | POA: Insufficient documentation

## 2016-04-21 MED ORDER — FLUORESCEIN SODIUM 0.6 MG OP STRP
1.0000 | ORAL_STRIP | Freq: Once | OPHTHALMIC | Status: DC
  Filled 2016-04-21: qty 1

## 2016-04-21 MED ORDER — TETANUS-DIPHTH-ACELL PERTUSSIS 5-2.5-18.5 LF-MCG/0.5 IM SUSP
0.5000 mL | Freq: Once | INTRAMUSCULAR | Status: AC
  Administered 2016-04-21: 0.5 mL via INTRAMUSCULAR
  Filled 2016-04-21: qty 0.5

## 2016-04-21 MED ORDER — ERYTHROMYCIN 5 MG/GM OP OINT: TOPICAL_OINTMENT | OPHTHALMIC | 0 refills | Status: DC

## 2016-04-21 MED ORDER — TETRACAINE HCL 0.5 % OP SOLN
1.0000 [drp] | Freq: Once | OPHTHALMIC | Status: DC
  Filled 2016-04-21: qty 4

## 2016-04-21 NOTE — Discharge Instructions (Signed)
Apply antibiotic ointment 4 times daily for the next 5 days. Ibuprofen and tylenol are okay for pain.

## 2016-04-21 NOTE — ED Triage Notes (Signed)
Per interpreter system, pt states it feels like something is in his L eye since yesterday. He was working on a roof and thinks a piece of debris is in his L eye. He has redness and slight periorbital swelling to L eye. States eye is irritates and tearing. Last tetanus unknown.

## 2016-04-21 NOTE — ED Provider Notes (Signed)
[facility]-EMER[facility]ENCY DEPT Provider Note   CSN: [id number] Arrival date & time: 04/21/16  1646  By signing my name below, I, Rosario Adie, attest that this documentation has been prepared under the direction and in the presence of [facility]. Electronically Signed: Rosario Adie, ED Scribe. 04/21/16. 5:38 PM.  History   Chief Complaint Chief Complaint  Patient presents with  . Eye Pain   The history is provided by the patient. A language interpreter was used [place], [id number]).    HPI Comments: Perry Mercado is a 41 y.o. male with no pertinent PMHx, who presents to the Emergency Department complaining of persistent, unchanged left eye pain with associated sensation of foreign body beginning yesterday. Per pt, he works in [employer] and yesterday while working on a roof he believes that a foreign body may have entered into his eye. No other noted sources of contamination. He reports associated eye redness, increased tearing, and chills secondary to the onset of his pain. He mentions that he has been flushing the eye with water at home without relief of his symptoms. No other noted treatments were tried prior to coming into the ED. He does not wear contact or corrective lenses. Pt denies any diplopia, blurry vision, photophobia, visual changes otherwise, fever, chest pain, shortness of breath, headache, numbness, or any other associated symptoms. Last tdap is unknown.   Past Medical History:  Diagnosis Date  . Medical history non-contributory    Patient Active Problem List   Diagnosis Date Noted  . Alcohol dependence (HCC) 06/03/2013   Past Surgical History:  Procedure Laterality Date  . NO PAST SURGERIES      Home Medications    Prior to Admission medications   Medication Sig Start Date End Date Taking? Authorizing Provider  ranitidine (ZANTAC) 150 MG capsule Take 1 capsule (150 mg total) by mouth daily. 01/21/16   April Palumbo, MD   Family History No family  history on file.  Social History Social History  Substance Use Topics  . Smoking status: Current Every Day Smoker    Packs/day: 2.00    Types: Cigarettes  . Smokeless tobacco: Never Used  . Alcohol use Yes     Comment: weekly/ binge   Allergies   Patient has no known allergies.  Review of Systems Review of Systems  Constitutional: Positive for chills. Negative for fever.  Eyes: Positive for pain and redness. Negative for photophobia and visual disturbance.  Respiratory: Negative for shortness of breath.   Cardiovascular: Negative for chest pain.  Neurological: Negative for numbness and headaches.   Physical Exam Updated Vital Signs BP (!) 144/92 (BP Location: Right Arm)   Pulse 73   Temp 98.1 F (36.7 C) (Oral)   Resp 20   SpO2 100%   Physical Exam  Constitutional: He appears well-developed and well-nourished. No distress.  HENT:  Head: Normocephalic and atraumatic.  Eyes: EOM are normal. Pupils are equal, round, and reactive to light.  20/20 vision bilaterally. Left eye with erythematous conjunctiva and tearful. No drainage noted. No pain with EOM movements. No ptosis, swelling, or erythema of the eyelids. Palpebral conjunctiva normal. On Fluorescein stain there is a 72mmx2mm triangular area of white uptake along the nasal side of the sclera near the iris. No rust rings noted.   Neck: Normal range of motion. No JVD present. No tracheal deviation present.  Cardiovascular: Normal rate, regular rhythm and normal heart sounds.   Pulmonary/Chest: Effort normal and breath sounds normal. No respiratory distress. He has  no wheezes.  Abdominal: He exhibits no distension.  Musculoskeletal: Normal range of motion. He exhibits no edema.  Neurological: He is alert.  Fluent speech. No facial droop. Sensation intact.   Skin: He is not diaphoretic. No pallor.  Psychiatric: He has a normal mood and affect. His behavior is normal.  Nursing note and vitals reviewed.  ED Treatments /  Results  DIAGNOSTIC STUDIES: Oxygen Saturation is 100% on RA, normal by my interpretation.   COORDINATION OF CARE: 5:37 PM-Discussed next steps with pt. Pt verbalized understanding and is agreeable with the plan.   Labs (all labs ordered are listed, but only abnormal results are displayed) Labs Reviewed - No data to display  EKG  EKG Interpretation None      Radiology No results found.  Procedures Procedures  Medications Ordered in ED Medications  Tdap (BOOSTRIX) injection 0.5 mL (not administered)  tetracaine (PONTOCAINE) 0.5 % ophthalmic solution 1-2 drop (not administered)  fluorescein ophthalmic strip 1 strip (not administered)   Initial Impression / Assessment and Plan / ED Course  I have reviewed the triage vital signs and the nursing notes.  Pertinent labs & imaging results that were available during my care of the patient were reviewed by me and considered in my medical decision making (see chart for details).     Pt with corneal abrasion on PE. Tdap given. No evidence of FB. No change in vision, acuity equal bilaterally. Exam non-concerning for orbital cellulitis, preseptal cellulitis, hyphema. Patient will be discharged home with erythromycin QID for 5 days. Ibuprofen for pain. Patient will follow up with ophthalmology, & return to ER if new symptoms develop including change in vision, purulent drainage, or entrapment. Pt verbalized understanding of and agreement with plan and is safe for discharge home at this time.   Final Clinical Impressions(s) / ED Diagnoses   Final diagnoses:  None   New Prescriptions New Prescriptions   No medications on file   Pt verbalized understanding of and agreement with plan and is safe for discharge home at this time.    Jeanie Sewer, PA-C 04/22/16 2103    Lyndal Pulley, MD 04/23/16 [phone]

## 2016-05-12 ENCOUNTER — Emergency Department (HOSPITAL_COMMUNITY): Payer: Self-pay

## 2016-05-12 ENCOUNTER — Emergency Department (HOSPITAL_COMMUNITY)
Admission: EM | Admit: 2016-05-12 | Discharge: 2016-05-12 | Disposition: A | Discharge status: Home / Self Care | Payer: Self-pay | Attending: Emergency Medicine | Admitting: Emergency Medicine

## 2016-05-12 DIAGNOSIS — R930 Abnormal findings on diagnostic imaging of skull and head, not elsewhere classified: Secondary | ICD-10-CM | POA: Insufficient documentation

## 2016-05-12 DIAGNOSIS — Z79899 Other long term (current) drug therapy: Secondary | ICD-10-CM | POA: Insufficient documentation

## 2016-05-12 DIAGNOSIS — F10920 Alcohol use, unspecified with intoxication, uncomplicated: Secondary | ICD-10-CM

## 2016-05-12 DIAGNOSIS — F17210 Nicotine dependence, cigarettes, uncomplicated: Secondary | ICD-10-CM | POA: Insufficient documentation

## 2016-05-12 DIAGNOSIS — Y929 Unspecified place or not applicable: Secondary | ICD-10-CM | POA: Insufficient documentation

## 2016-05-12 DIAGNOSIS — Y999 Unspecified external cause status: Secondary | ICD-10-CM | POA: Insufficient documentation

## 2016-05-12 DIAGNOSIS — Y939 Activity, unspecified: Secondary | ICD-10-CM | POA: Insufficient documentation

## 2016-05-12 DIAGNOSIS — F10120 Alcohol abuse with intoxication, uncomplicated: Secondary | ICD-10-CM | POA: Insufficient documentation
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
## 2016-05-12 NOTE — ED Triage Notes (Signed)
Pt able to ambulate without difficulty. Discharged to bus stop
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
## 2016-06-11 ENCOUNTER — Emergency Department (HOSPITAL_COMMUNITY)
Admission: EM | Admit: 2016-06-11 | Discharge: 2016-06-12 | Disposition: A | Discharge status: Home / Self Care | Payer: Self-pay | Attending: Emergency Medicine | Admitting: Emergency Medicine

## 2016-06-11 DIAGNOSIS — F10120 Alcohol abuse with intoxication, uncomplicated: Secondary | ICD-10-CM | POA: Insufficient documentation

## 2016-06-11 DIAGNOSIS — F10920 Alcohol use, unspecified with intoxication, uncomplicated: Secondary | ICD-10-CM
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
## 2016-06-12 NOTE — ED Notes (Signed)
Breakfast tray given. °

## 2016-06-12 NOTE — ED Notes (Addendum)
No bus passes available at this time, pt advised he is welcome to wait in the lobby ad check with front desk in 30 minutes or so.Pt ambulated to the waiting room with steady gait.  He voiced understanding.
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
## 2016-06-12 NOTE — ED Provider Notes (Signed)
[facility]-EMERGENCY DEPT Provider Note   CSN: [id number] Arrival date & time: 06/11/16  2134     History   Chief Complaint Chief Complaint  Patient presents with  . Alcohol Intoxication  . Extremity Laceration    HPI Perry Mercado is a 41 y.o. male.  HPI Level 5 caviate due to alcohol intoxication.   Patient is a 41 year old Hispanic male with history of alcohol abuse. Has been seen several times in the ED for alcohol intoxication. Was brought in by EMS after being intoxicated and being verbally aggressive with others. The patient is Spanish-speaking. EMS notes a laceration to his left ring finger. Patient is very vocal in EMS and ED. No past medical history on file.  There are no active problems to display for this patient.   No past surgical history on file.     Home Medications    Prior to Admission medications   Not on File    Family History No family history on file.  Social History Social History  Substance Use Topics  . Smoking status: Not on file  . Smokeless tobacco: Not on file  . Alcohol use Not on file     Allergies   Patient has no allergy information on record.   Review of Systems Review of Systems  Reason unable to perform ROS: intoxication.     Physical Exam Updated Vital Signs There were no vitals taken for this visit.  Physical Exam  Constitutional: He is oriented to person, place, and time. He appears well-developed and well-nourished. No distress.  HENT:  Head: Normocephalic and atraumatic.  Mouth/Throat: Oropharynx is clear and moist.  Mucous membranes are moist.  Eyes: Conjunctivae and EOM are normal. Pupils are equal, round, and reactive to light. Right eye exhibits no discharge. Left eye exhibits no discharge. No scleral icterus.  Neck: Normal range of motion. Neck supple. No thyromegaly present.  No midline tenderness. No deformities or step-offs noted.  Cardiovascular: Normal rate, regular rhythm, normal heart sounds and  intact distal pulses.  Exam reveals no gallop and no friction rub.   No murmur heard. Pulmonary/Chest: Effort normal and breath sounds normal. No respiratory distress. He has no wheezes. He has no rales. He exhibits no tenderness.  Abdominal: Soft. Bowel sounds are normal. He exhibits no distension. There is no tenderness. There is no rebound and no guarding.  No ecchymosis.  Musculoskeletal: Normal range of motion.  The patient does have small abrasion to the radial aspect of the left middle finger over the PIP. Full range of motion. No active bleeding. Very superficial. Radial pulses are 2+ bilaterally. Sensation intact to sharp/total. Cap refill normal.  Lymphadenopathy:    He has no cervical adenopathy.  Neurological: He is alert and oriented to person, place, and time.  The patient is alert, attentive, and oriented x 3. Speech is clear. Cranial nerve II-VII grossly intact. Negative pronator drift. Sensation intact. Strength 5/5 in all extremities. Reflexes 2+ and symmetric at biceps, triceps, knees, and ankles. Rapid alternating movement and fine finger movements intact. Romberg is absent. Posture and gait normal.   Skin: Skin is warm and dry. Capillary refill takes less than 2 seconds.  Nursing note and vitals reviewed.    ED Treatments / Results  Labs (all labs ordered are listed, but only abnormal results are displayed) Labs Reviewed - No data to display  EKG  EKG Interpretation None       Radiology No results found.  Procedures Procedures (including critical care time)  Medications Ordered in ED Medications - No data to display   Initial Impression / Assessment and Plan / ED Course  I have reviewed the triage vital signs and the nursing notes.  Pertinent labs & imaging results that were available during my care of the patient were reviewed by me and considered in my medical decision making (see chart for details).     Patient presents to the ED by EMS for  alcohol intoxication. History of same. On initial exam patient is clinically intoxicated and difficult historian. Does have abrasion to the left middle finger. Area was cleaned and bandaged. Did not require stitches. Patient is neurovascularly intact.   Patient has been sobering up overnight. He was reassessed this morning by myself. The patient is clinically sober. Translator was used. Exam was performed that showed no focal neuro deficits. Exam was unremarkable. Patient is able to eat and drink without any difficulties. States that his tetanus shot is up-to-date. Patient able to ambulate with normal gait by himself. Appears to be clinically sober. Has called for a ride and will be displayed with his ride. No obvious intracranial, intra-abdominal, intrathoracic injuries.  Final Clinical Impressions(s) / ED Diagnoses   Final diagnoses:  Alcoholic intoxication without complication [facility](HCC)    New Prescriptions New Prescriptions   No medications on file     Wallace KellerLeaphart, Kenneth T, PA-C 06/12/16 0705    Paula LibraMolpus, John, MD 06/12/16 [phone]

## 2016-06-12 NOTE — ED Notes (Signed)
Pt is asking to call his friend Melva to come pick him up [phone]), called the number, no answer

## 2016-07-15 ENCOUNTER — Emergency Department (HOSPITAL_COMMUNITY): Payer: Self-pay

## 2016-07-15 ENCOUNTER — Emergency Department (HOSPITAL_COMMUNITY)
Admission: EM | Admit: 2016-07-15 | Discharge: 2016-07-15 | Disposition: A | Discharge status: Home / Self Care | Payer: Self-pay | Attending: Emergency Medicine | Admitting: Emergency Medicine

## 2016-07-15 ENCOUNTER — Encounter (HOSPITAL_COMMUNITY): Payer: Self-pay | Admitting: Emergency Medicine

## 2016-07-15 DIAGNOSIS — Y9289 Other specified places as the place of occurrence of the external cause: Secondary | ICD-10-CM | POA: Insufficient documentation

## 2016-07-15 DIAGNOSIS — Z23 Encounter for immunization: Secondary | ICD-10-CM | POA: Insufficient documentation

## 2016-07-15 DIAGNOSIS — Y999 Unspecified external cause status: Secondary | ICD-10-CM | POA: Insufficient documentation

## 2016-07-15 DIAGNOSIS — W108XXA Fall (on) (from) other stairs and steps, initial encounter: Secondary | ICD-10-CM | POA: Insufficient documentation

## 2016-07-15 DIAGNOSIS — S71132A Puncture wound without foreign body, left thigh, initial encounter: Secondary | ICD-10-CM

## 2016-07-15 DIAGNOSIS — Y939 Activity, unspecified: Secondary | ICD-10-CM | POA: Insufficient documentation

## 2016-07-15 DIAGNOSIS — Z043 Encounter for examination and observation following other accident: Secondary | ICD-10-CM | POA: Insufficient documentation

## 2016-07-15 DIAGNOSIS — S71142A Puncture wound with foreign body, left thigh, initial encounter: Secondary | ICD-10-CM | POA: Insufficient documentation

## 2016-07-15 DIAGNOSIS — R58 Hemorrhage, not elsewhere classified: Secondary | ICD-10-CM

## 2016-07-15 DIAGNOSIS — F17210 Nicotine dependence, cigarettes, uncomplicated: Secondary | ICD-10-CM | POA: Insufficient documentation

## 2016-07-15 DIAGNOSIS — F10920 Alcohol use, unspecified with intoxication, uncomplicated: Secondary | ICD-10-CM | POA: Insufficient documentation

## 2016-07-15 LAB — I-STAT CHEM 8, ED
BUN: 6 mg/dL (ref 6–20)
CALCIUM ION: 1.15 mmol/L (ref 1.15–1.40)
CREATININE: 1.1 mg/dL (ref 0.61–1.24)
Chloride: 100 mmol/L — ABNORMAL LOW (ref 101–111)
GLUCOSE: 102 mg/dL — AB (ref 65–99)
HEMATOCRIT: 44 % (ref 39.0–52.0)
Hemoglobin: 15 g/dL (ref 13.0–17.0)
Potassium: 3.1 mmol/L — ABNORMAL LOW (ref 3.5–5.1)
Sodium: 139 mmol/L (ref 135–145)
TCO2: 22 mmol/L (ref 0–100)

## 2016-07-15 LAB — CBC
HEMATOCRIT: 43.2 % (ref 39.0–52.0)
HEMOGLOBIN: 15.1 g/dL (ref 13.0–17.0)
MCH: 32.4 pg (ref 26.0–34.0)
MCHC: 35 g/dL (ref 30.0–36.0)
MCV: 92.7 fL (ref 78.0–100.0)
Platelets: 171 10*3/uL (ref 150–400)
RBC: 4.66 MIL/uL (ref 4.22–5.81)
RDW: 13.3 % (ref 11.5–15.5)
WBC: 7.7 10*3/uL (ref 4.0–10.5)

## 2016-07-15 MED ORDER — POTASSIUM CHLORIDE CRYS ER 20 MEQ PO TBCR
40.0000 meq | EXTENDED_RELEASE_TABLET | Freq: Once | ORAL | Status: AC
  Administered 2016-07-15: 40 meq via ORAL
  Filled 2016-07-15: qty 2

## 2016-07-15 MED ORDER — CLINDAMYCIN PHOSPHATE 600 MG/50ML IV SOLN
600.0000 mg | Freq: Once | INTRAVENOUS | Status: AC
  Administered 2016-07-15: 600 mg via INTRAVENOUS
  Filled 2016-07-15: qty 50

## 2016-07-15 MED ORDER — ACETAMINOPHEN 325 MG PO TABS
650.0000 mg | ORAL_TABLET | Freq: Once | ORAL | Status: AC
  Administered 2016-07-15: 650 mg via ORAL
  Filled 2016-07-15: qty 2

## 2016-07-15 MED ORDER — CLINDAMYCIN HCL 300 MG PO CAPS: 300.0000 mg | ORAL_CAPSULE | Freq: Three times a day (TID) | ORAL | 0 refills | Status: DC

## 2016-07-15 MED ORDER — TETANUS-DIPHTH-ACELL PERTUSSIS 5-2.5-18.5 LF-MCG/0.5 IM SUSP
0.5000 mL | Freq: Once | INTRAMUSCULAR | Status: AC
  Administered 2016-07-15: 0.5 mL via INTRAMUSCULAR
  Filled 2016-07-15: qty 0.5

## 2016-07-15 NOTE — Progress Notes (Signed)
Responded to page to ED, D 36, for Level 2, male fell off porch, laceration. EMS advised pt was reportedly not at home when this occurred. To their knowledge, no family were coming in. Chaplain available for f/u.   07/15/16 0400  Clinical Encounter Type  Visited With Patient not available  Visit Type Initial;Psychological support;Spiritual support;Social support;ED  Referral From Nurse   Ephraim Hamburgerynthia A Berthel Bagnall, Chaplain

## 2016-07-15 NOTE — Discharge Instructions (Addendum)
Please change your packing and dressing twice a day.  Return to the ER for signs of infection  Please take all of the antibiotics as prescribed

## 2016-07-15 NOTE — ED Notes (Addendum)
Spoke to patient with interpreter device, he provided the number [phone] for family member, attempted to call this number with no answer.

## 2016-07-15 NOTE — ED Notes (Addendum)
[phone] for patient friend.

## 2016-07-15 NOTE — ED Notes (Addendum)
Assisted patient with dressing. Spoke to patient with EDP using the interpreter device. Updated patient on situation, what happened and plan of care. Pt verbalized understanding.

## 2016-07-15 NOTE — ED Notes (Signed)
Spoke with Consulting civil engineerCharge RN and MD about pt's transportation situation and discharge and we all agreed safest route would be via cab. Cab voucher provided, as well as crutches and d/c instructions using [employer] interpreter.

## 2016-07-15 NOTE — ED Provider Notes (Signed)
[facility]-EMERGENCY DEPT Provider Note   CSN: [id number] Arrival date & time: 07/15/16  0451     History   Chief Complaint Chief Complaint  Patient presents with  . Fall    level 2 trauma   Level V caveat: Intoxication  HPI Perry Mercado is a [age] y.o. male.  HPI Patient is brought to the emergency department after reported fall off a 2-3 foot porch where his left thigh was impaled with a large metal stake.  The steak was cut at the scene and he was brought to the emergency department with the impaled object still in his left thigh.  His reported his been drinking a large amount of beer tonight.  He is unable to find any reasonable history.  He moves all 4 extremities.  He has no other complaints besides left thigh pain.  He is able to wiggle his toes on his left foot.   History reviewed. No pertinent past medical history.  There are no active problems to display for this patient.   History reviewed. No pertinent surgical history.     Home Medications    Prior to Admission medications   Medication Sig Start Date End Date Taking? Authorizing Provider  clindamycin (CLEOCIN) 300 MG capsule Take 1 capsule (300 mg total) by mouth 3 (three) times daily. 07/15/16   Azalia Bilisampos, Tyanne Derocher, MD    Family History History reviewed. No pertinent family history.  Social History Social History  Substance Use Topics  . Smoking status: Unknown If Ever Smoked  . Smokeless tobacco: Not on file  . Alcohol use Yes     Allergies   Patient has no known allergies.   Review of Systems Review of Systems  Unable to perform ROS: Other     Physical Exam Updated Vital Signs BP 100/63   Pulse 80   Temp (!) 97.3 F (36.3 C) Comment: oral  Resp 16   Ht 5\' 8"  (1.727 m)   Wt 90.7 kg (200 lb)   SpO2 96%   BMI 30.41 kg/m   Physical Exam  Constitutional: He appears well-developed and well-nourished.  HENT:  Head: Normocephalic and atraumatic.  Eyes: EOM are normal.  Neck: Normal range  of motion.  Cardiovascular: Normal rate and regular rhythm.   Pulmonary/Chest: Effort normal and breath sounds normal. No respiratory distress.  Abdominal: Soft. He exhibits no distension. There is no tenderness.  Musculoskeletal:  Full range of motion of bilateral shoulders, elbows and wrists. Full range of motion of bilateral hips, knees and ankles.  Large metal bar impaled in the posterior lateral mid left thigh.  Easily removed without difficulty.  Wound tracks superiorly and does not dive deep.  No active bleeding from the wound site.  Normal PT and DP pulse in the left foot.  Left thigh itself is soft without significant hematoma   Neurological: He is alert.  Skin: Skin is warm and dry.  Psychiatric: He has a normal mood and affect. Judgment normal.  Nursing note and vitals reviewed.    ED Treatments / Results  Labs (all labs ordered are listed, but only abnormal results are displayed) Labs Reviewed  I-STAT CHEM 8, ED - Abnormal; Notable for the following:       Result Value   Potassium 3.1 (*)    Chloride 100 (*)    Glucose, Bld 102 (*)    All other components within normal limits  CBC    EKG  EKG Interpretation None  Radiology Dg Femur Min 2 Views Left  Result Date: 07/15/2016 CLINICAL DATA:  Penetrating trauma to the left posterolateral thigh. EXAM: LEFT FEMUR 2 VIEWS COMPARISON:  None. FINDINGS: There is no acute fracture or dislocation. The bones are well mineralized. No arthritic changes. Small pockets of soft tissue air noted in the posterior left upper thigh. No radiopaque foreign object. IMPRESSION: 1. No acute/traumatic osseous pathology. 2. Left posterior upper thigh soft tissue air consistent with history of penetrating injury. No radiopaque foreign object. Electronically Signed   By: Elgie Collard M.D.   On: 07/15/2016 05:58    Procedures Procedures (including critical care time)  Medications Ordered in ED Medications  clindamycin (CLEOCIN) IVPB  600 mg (0 mg Intravenous Stopped 07/15/16 0708)  Tdap (BOOSTRIX) injection 0.5 mL (0.5 mLs Intramuscular Given 07/15/16 0615)     Initial Impression / Assessment and Plan / ED Course  I have reviewed the triage vital signs and the nursing notes.  Pertinent labs & imaging results that were available during my care of the patient were reviewed by me and considered in my medical decision making (see chart for details).     Tetanus updated.  IV clindamycin.  Repeat evaluation the wound demonstrates no developing hematoma.  In 2 days to have normal arterial pulses.  No signs to suggest significant venous bleeding.  Wet-to-dry dressing applied.  Patient will remain emergency department and sober.  Chest and abdomen are benign.  Normal strength in arms.  Wiggles toes.  Final Clinical Impressions(s) / ED Diagnoses   Final diagnoses:  Penetrating wound of left thigh, initial encounter    New Prescriptions New Prescriptions   CLINDAMYCIN (CLEOCIN) 300 MG CAPSULE    Take 1 capsule (300 mg total) by mouth 3 (three) times daily.     Azalia Bilis, MD 07/15/16 [phone]

## 2016-07-15 NOTE — ED Notes (Signed)
Regular diet ordered for patient. 

## 2016-07-15 NOTE — Discharge Instructions (Signed)
As discussed, your evaluation today has been largely reassuring.  But, it is important that you monitor your condition carefully, and do not hesitate to return to the ED if you develop new, or concerning changes in your condition.  Please keep your wound clean and dry.

## 2016-07-15 NOTE — ED Triage Notes (Addendum)
Pt brought to ED by [facility] from home after a fall from his porch, pt has a penetrating wound with a pice of metal inside of around 5 inches long. Bleeding controlled.

## 2016-07-15 NOTE — ED Provider Notes (Addendum)
[facility]-EMERGENCY DEPT Provider Note   CSN: [id number] Arrival date & time: 07/15/16  1940     History   Chief Complaint Chief Complaint  Patient presents with  . Wound Check    HPI Perry Mercado is a 41 y.o. male.  HPI  Patient presents for second time today, now with concern of ongoing bleeding, pain from a recent trauma. Patient notes that since discharge he has been drinking alcohol, approximately 8 beers. He is concerned over ongoing bleeding from a left posterior lateral leg wound. No new complaints including new loss of sensation in his distal extremity, new syncope, new fever, no chills.   Past Medical History:  Diagnosis Date  . Medical history non-contributory     Patient Active Problem List   Diagnosis Date Noted  . Alcohol dependence (HCC) 06/03/2013    Past Surgical History:  Procedure Laterality Date  . NO PAST SURGERIES         Home Medications    Prior to Admission medications   Medication Sig Start Date End Date Taking? Authorizing Provider  erythromycin ophthalmic ointment Place a 1/2 inch ribbon of ointment into the lower eyelid 4 times daily for the next 5 days 04/21/16   Michela PitcherFawze, Mina A, PA-C  ranitidine (ZANTAC) 150 MG capsule Take 1 capsule (150 mg total) by mouth daily. 01/21/16   Palumbo, April, MD    Family History No family history on file.  Social History Social History  Substance Use Topics  . Smoking status: Current Every Day Smoker    Packs/day: 2.00    Types: Cigarettes  . Smokeless tobacco: Never Used  . Alcohol use Yes     Comment: weekly/ binge     Allergies   Patient has no known allergies.   Review of Systems Review of Systems  Constitutional:       Per HPI, otherwise negative  HENT:       Per HPI, otherwise negative  Respiratory:       Per HPI, otherwise negative  Cardiovascular:       Per HPI, otherwise negative  Gastrointestinal: Negative for vomiting.  Endocrine:       Negative aside from HPI    Genitourinary:       Neg aside from HPI   Musculoskeletal:       Per HPI, otherwise negative  Skin: Positive for wound.  Allergic/Immunologic: Negative for immunocompromised state.  Neurological: Negative for syncope.  Hematological:       Ongoing bleeding, otherwise no history     Physical Exam Updated Vital Signs BP 108/84 (BP Location: Left Arm)   Pulse 89   Temp 98.5 F (36.9 C) (Oral)   Resp 16   SpO2 99%   Physical Exam  Constitutional: He is oriented to person, place, and time. He appears well-developed. No distress.  HENT:  Head: Normocephalic and atraumatic.  Eyes: Conjunctivae and EOM are normal.  Cardiovascular: Normal rate and regular rhythm.   Pulmonary/Chest: Effort normal. No stridor. No respiratory distress.  Abdominal: He exhibits no distension.  Musculoskeletal:       Legs: Neurological: He is alert and oriented to person, place, and time.  Left lower extremity distally neurovascularly intact.  Skin: Skin is warm and dry.  Psychiatric: He has a normal mood and affect.  Nursing note and vitals reviewed.   10:46 PM No bleeding through the new dressing.  Patient sleeping.  ED Treatments / Results  After the initial evaluation the patient's chart was reviewed.  This was completed by the fact the patient has multiple medical record numbers. However, after reviewing the patient's chart he was seen here earlier today after falling on what sounds to be a metal spike. Patient was started on antibiotics, received his tetanus update, had evaluation, wound treatment. On evaluation with appears in similar condition, without substantial active bleeding. We removed the remaining dressing, cleaned the wound, placed a new dressing with application. This was well tolerated, with no saturation of the new bandaging. Patient discharged in stable condition with trauma clinic follow-up. Procedures Procedures (including critical care time)  Medications Ordered in  ED Medications - No data to display   Initial Impression / Assessment and Plan / ED Course  I have reviewed the triage vital signs and the nursing notes.  Pertinent labs & imaging results that were available during my care of the patient were reviewed by me and considered in my medical decision making (see chart for details).    Final Clinical Impressions(s) / ED Diagnoses  Bleeding   Gerhard Munch, MD 07/15/16 1914    Gerhard Munch, MD 07/15/16 2246

## 2016-07-15 NOTE — ED Provider Notes (Addendum)
745 amPatient is sleepy arousable to  tactile stimulus. Moves all extremities    [name], Rainey Rodger, MD 07/15/16 0856 8:50 AM patient speaks Spanish. History is obtained through a [place] professional medical interpreter. Patient complains of left thigh pain. No other complaint. He reports he was drinking alcohol last night he has no recall of any injury.Perry Mercado. He is presently hungry. On exam patient is alert HEENT exam psych atraumatic neck supple lungs clear auscultation heart regular rate and rhythm abdomen nondistended and tender. Left lower extremity there is a silver dollar size puncture wound to the lateral aspect of the thigh with no surrounding hematoma. Packing was removed. No active bleeding. DP pulse 2+ all other extremities no contusion abrasion or tenderness neurovascular intact. Neurologic cranial nerves II through XII grossly intact. Moves all extremities well. Walks with a limp favoring the left lower extremity X-ray viewed by me Results for orders placed or performed during the hospital encounter of 07/15/16  CBC  Result Value Ref Range   WBC 7.7 4.0 - 10.5 K/uL   RBC 4.66 4.22 - 5.81 MIL/uL   Hemoglobin 15.1 13.0 - 17.0 g/dL   HCT 16.143.2 09.639.0 - 04.552.0 %   MCV 92.7 78.0 - 100.0 fL   MCH 32.4 26.0 - 34.0 pg   MCHC 35.0 30.0 - 36.0 g/dL   RDW 40.913.3 81.111.5 - 91.415.5 %   Platelets 171 150 - 400 K/uL  I-Stat Chem 8, ED  Result Value Ref Range   Sodium 139 135 - 145 mmol/L   Potassium 3.1 (L) 3.5 - 5.1 mmol/L   Chloride 100 (L) 101 - 111 mmol/L   BUN 6 6 - 20 mg/dL   Creatinine, Ser 7.821.10 0.61 - 1.24 mg/dL   Glucose, Bld 956102 (H) 65 - 99 mg/dL   Calcium, Ion 2.131.15 0.861.15 - 1.40 mmol/L   TCO2 22 0 - 100 mmol/L   Hemoglobin 15.0 13.0 - 17.0 g/dL   HCT 57.844.0 46.939.0 - 62.952.0 %   Dg Femur Min 2 Views Left  Result Date: 07/15/2016 CLINICAL DATA:  Penetrating trauma to the left posterolateral thigh. EXAM: LEFT FEMUR 2 VIEWS COMPARISON:  None. FINDINGS: There is no acute fracture or dislocation. The bones  are well mineralized. No arthritic changes. Small pockets of soft tissue air noted in the posterior left upper thigh. No radiopaque foreign object. IMPRESSION: 1. No acute/traumatic osseous pathology. 2. Left posterior upper thigh soft tissue air consistent with history of penetrating injury. No radiopaque foreign object. Electronically Signed   By: Elgie CollardArash  Radparvar M.D.   On: 07/15/2016 05:58   12:40 PM patient is able to ambulate with crutches. He received oral potassium supplementation while here. He is discharged with crutches   [name], Shanna Un, MD 07/15/16 1252

## 2016-07-15 NOTE — ED Notes (Signed)
ED Provider at bedside. 

## 2016-07-15 NOTE — ED Triage Notes (Signed)
[facility]. Pt seen here yesterday after his L upper leg was impaled by a stake (see prior note). Bleeding was controlled upon discharge. Pt states that it started oozing earlier today and then bled more over the past few hours. Pt endorses having 8 beers today but denies any new injury or messing of wound to cause rebleeding. Pt denies dizziness. States his leg is too painful to move, but ambulatory with assistance. CSM intact distal to injury. Upon FD arrival, bleeding was controlled but EMS applied pressure dressing and estimates pt lost 150cc PTA.

## 2016-07-16 ENCOUNTER — Encounter (HOSPITAL_COMMUNITY): Payer: Self-pay | Admitting: Emergency Medicine

## 2016-07-19 ENCOUNTER — Encounter (HOSPITAL_COMMUNITY): Payer: Self-pay | Admitting: Emergency Medicine

## 2016-07-19 ENCOUNTER — Emergency Department (HOSPITAL_COMMUNITY)
Admission: EM | Admit: 2016-07-19 | Discharge: 2016-07-19 | Disposition: A | Discharge status: Home / Self Care | Payer: Self-pay | Attending: Emergency Medicine | Admitting: Emergency Medicine

## 2016-07-19 DIAGNOSIS — W228XXA Striking against or struck by other objects, initial encounter: Secondary | ICD-10-CM | POA: Insufficient documentation

## 2016-07-19 DIAGNOSIS — F17210 Nicotine dependence, cigarettes, uncomplicated: Secondary | ICD-10-CM | POA: Insufficient documentation

## 2016-07-19 DIAGNOSIS — Z5189 Encounter for other specified aftercare: Secondary | ICD-10-CM | POA: Insufficient documentation

## 2016-07-19 NOTE — Discharge Instructions (Signed)
°  Contine tomando antibiticos segn lo recetado previamente. Regrese a la sala de emergencias por empeoramiento del dolor, lesin adicional, enrojecimiento, drenaje de pus del sitio, fiebre.

## 2016-07-19 NOTE — ED Triage Notes (Signed)
Pt here for wound eval to right leg; pt seen here x 2 for same and was impaled with rusty object; pt denies drainage or increased pain but doesn't understand why wound was left open and not closed; spanish interpretor needed

## 2016-07-19 NOTE — ED Provider Notes (Signed)
[facility]-EMERGENCY DEPT Provider Note   CSN: [id number] Arrival date & time: 07/19/16  [id number]     History   Chief Complaint Chief Complaint  Patient presents with  . Wound Check    HPI Perry Mercado is a 41 y.o. male.  HPI Patient presents to ED for concerns of ongoing bleeding from wound on left leg. He states that he got the wound after a large metal bar impelled him on the posterior lateral mid left thigh. He reports keeping the area dressed but has not changed in the last 3 days. He denies any loss of sensation, numbness, weakness, fever, chills, additional injury. He reports compliance with his antibiotics. Of note, patient was seen here 4 days ago for wound check. Has not followed up at trauma clinic.  History was obtained with interpreter.  Past Medical History:  Diagnosis Date  . Medical history non-contributory     Patient Active Problem List   Diagnosis Date Noted  . Alcohol dependence (HCC) 06/03/2013    Past Surgical History:  Procedure Laterality Date  . NO PAST SURGERIES         Home Medications    Prior to Admission medications   Medication Sig Start Date End Date Taking? Authorizing Provider  clindamycin (CLEOCIN) 300 MG capsule Take 1 capsule (300 mg total) by mouth 3 (three) times daily. 07/15/16   Azalia Bilisampos, Kevin, MD  erythromycin ophthalmic ointment Place a 1/2 inch ribbon of ointment into the lower eyelid 4 times daily for the next 5 days 04/21/16   Michela PitcherFawze, Mina A, PA-C  ranitidine (ZANTAC) 150 MG capsule Take 1 capsule (150 mg total) by mouth daily. 01/21/16   Palumbo, April, MD    Family History History reviewed. No pertinent family history.  Social History Social History  Substance Use Topics  . Smoking status: Current Every Day Smoker    Packs/day: 2.00    Types: Cigarettes  . Smokeless tobacco: Never Used  . Alcohol use Yes     Comment: weekly/ binge     Allergies   Patient has no known allergies.   Review of Systems Review of Systems   Constitutional: Negative for appetite change, chills and fever.  HENT: Negative for ear pain, rhinorrhea, sneezing and sore throat.   Eyes: Negative for photophobia and visual disturbance.  Respiratory: Negative for cough, chest tightness, shortness of breath and wheezing.   Cardiovascular: Negative for chest pain and palpitations.  Gastrointestinal: Negative for abdominal pain, blood in stool, constipation, diarrhea, nausea and vomiting.  Genitourinary: Negative for dysuria, hematuria and urgency.  Musculoskeletal: Negative for myalgias.  Skin: Positive for wound. Negative for rash.  Neurological: Negative for dizziness, weakness and light-headedness.     Physical Exam Updated Vital Signs BP 110/70   Pulse 80   Resp 16   SpO2 100%   Physical Exam  Constitutional: He appears well-developed and well-nourished. No distress.  HENT:  Head: Normocephalic and atraumatic.  Nose: Nose normal.  Eyes: Conjunctivae and EOM are normal. Left eye exhibits no discharge. No scleral icterus.  Neck: Normal range of motion. Neck supple.  Cardiovascular: Normal rate, regular rhythm, normal heart sounds and intact distal pulses.  Exam reveals no gallop and no friction rub.   No murmur heard. Pulmonary/Chest: Effort normal and breath sounds normal. No respiratory distress.  Abdominal: Soft. Bowel sounds are normal. He exhibits no distension. There is no tenderness. There is no guarding.  Musculoskeletal: Normal range of motion. He exhibits no edema.  Legs: Approximately 4 cm circular open wound on the left lateral upper thigh. There is soft tissue exposed. There is no active bleeding or drainage noted. Sensation intact in lower extremity. Strength 5/5 in bilateral lower extremities. 2+ DP pulses bilaterally.  Neurological: He is alert. He exhibits normal muscle tone. Coordination normal.  Skin: Skin is warm and dry. No rash noted.  Psychiatric: He has a normal mood and affect.  Nursing note and  vitals reviewed.    ED Treatments / Results  Labs (all labs ordered are listed, but only abnormal results are displayed) Labs Reviewed - No data to display  EKG  EKG Interpretation None       Radiology No results found.  Procedures Procedures (including critical care time)  Medications Ordered in ED Medications - No data to display   Initial Impression / Assessment and Plan / ED Course  I have reviewed the triage vital signs and the nursing notes.  Pertinent labs & imaging results that were available during my care of the patient were reviewed by me and considered in my medical decision making (see chart for details).     Patient presents to ED for follow-up of wound on left upper lateral thigh that occurred 4 days ago. He states that he is taking his antibiotics as prescribed 3 times daily. He is unsure why the wound was left open. He denies any increased pain, numbness, drainage from site. He is unsure what he should clean the wound with. He also states that he has not changed the wound dressing in about 3 days. On physical exam there is a 4 cm wound present on the left upper lateral thigh with no increase in drainage or bleeding. It appears consistent with the findings on his previous 2 ED visits regarding wound recheck. Dressing was changed here equally. Patient remains neurovascularly intact in the leg. He is afebrile with no history of fever. I advised patient on cleaning the wound and being able to shower as directed. He received his tetanus up-to-date during initial visit. I advised him to follow-up in the ED with any worsening or severe symptoms or signs of infection. Patient appears stable for discharge at this time. Strict return precautions given.  Final Clinical Impressions(s) / ED Diagnoses   Final diagnoses:  Visit for wound check    New Prescriptions New Prescriptions   No medications on file     Dietrich Pates, PA-C 07/19/16 1226    Cathren Laine,  MD 07/19/16 1245

## 2016-07-27 ENCOUNTER — Encounter (HOSPITAL_COMMUNITY): Payer: Self-pay | Admitting: Emergency Medicine

## 2016-07-27 ENCOUNTER — Emergency Department (HOSPITAL_COMMUNITY): Payer: Self-pay

## 2016-07-27 ENCOUNTER — Emergency Department (HOSPITAL_COMMUNITY)
Admission: EM | Admit: 2016-07-27 | Discharge: 2016-07-27 | Disposition: A | Discharge status: Home / Self Care | Payer: Self-pay | Attending: Emergency Medicine | Admitting: Emergency Medicine

## 2016-07-27 DIAGNOSIS — T148XXA Other injury of unspecified body region, initial encounter: Secondary | ICD-10-CM

## 2016-07-27 DIAGNOSIS — T8189XA Other complications of procedures, not elsewhere classified, initial encounter: Secondary | ICD-10-CM | POA: Insufficient documentation

## 2016-07-27 DIAGNOSIS — IMO0002 Reserved for concepts with insufficient information to code with codable children: Secondary | ICD-10-CM

## 2016-07-27 DIAGNOSIS — F17210 Nicotine dependence, cigarettes, uncomplicated: Secondary | ICD-10-CM | POA: Insufficient documentation

## 2016-07-27 DIAGNOSIS — Y828 Other medical devices associated with adverse incidents: Secondary | ICD-10-CM | POA: Insufficient documentation

## 2016-07-27 DIAGNOSIS — L089 Local infection of the skin and subcutaneous tissue, unspecified: Secondary | ICD-10-CM

## 2016-07-27 LAB — CBC WITH DIFFERENTIAL/PLATELET
Basophils Absolute: 0 10*3/uL (ref 0.0–0.1)
Basophils Relative: 0 %
EOS PCT: 0 %
Eosinophils Absolute: 0 10*3/uL (ref 0.0–0.7)
HEMATOCRIT: 39.6 % (ref 39.0–52.0)
Hemoglobin: 13.3 g/dL (ref 13.0–17.0)
LYMPHS ABS: 1.4 10*3/uL (ref 0.7–4.0)
Lymphocytes Relative: 19 %
MCH: 31 pg (ref 26.0–34.0)
MCHC: 33.6 g/dL (ref 30.0–36.0)
MCV: 92.3 fL (ref 78.0–100.0)
MONO ABS: 0.4 10*3/uL (ref 0.1–1.0)
MONOS PCT: 6 %
NEUTROS ABS: 5.6 10*3/uL (ref 1.7–7.7)
Neutrophils Relative %: 75 %
Platelets: 354 10*3/uL (ref 150–400)
RBC: 4.29 MIL/uL (ref 4.22–5.81)
RDW: 12.9 % (ref 11.5–15.5)
WBC: 7.5 10*3/uL (ref 4.0–10.5)

## 2016-07-27 LAB — COMPREHENSIVE METABOLIC PANEL
ALT: 93 U/L — ABNORMAL HIGH (ref 17–63)
ANION GAP: 8 (ref 5–15)
AST: 54 U/L — AB (ref 15–41)
Albumin: 4.2 g/dL (ref 3.5–5.0)
Alkaline Phosphatase: 97 U/L (ref 38–126)
BILIRUBIN TOTAL: 1.2 mg/dL (ref 0.3–1.2)
BUN: 7 mg/dL (ref 6–20)
CALCIUM: 9.7 mg/dL (ref 8.9–10.3)
CO2: 23 mmol/L (ref 22–32)
Chloride: 106 mmol/L (ref 101–111)
Creatinine, Ser: 0.84 mg/dL (ref 0.61–1.24)
Glucose, Bld: 95 mg/dL (ref 65–99)
Potassium: 4 mmol/L (ref 3.5–5.1)
Sodium: 137 mmol/L (ref 135–145)
TOTAL PROTEIN: 7.5 g/dL (ref 6.5–8.1)

## 2016-07-27 LAB — I-STAT CG4 LACTIC ACID, ED: Lactic Acid, Venous: 0.99 mmol/L (ref 0.5–1.9)

## 2016-07-27 MED ORDER — DOXYCYCLINE HYCLATE 100 MG PO CAPS: 100.0000 mg | ORAL_CAPSULE | Freq: Two times a day (BID) | ORAL | 0 refills | Status: DC

## 2016-07-27 MED ORDER — DOXYCYCLINE HYCLATE 100 MG PO TABS
100.0000 mg | ORAL_TABLET | Freq: Once | ORAL | Status: AC
  Administered 2016-07-27: 100 mg via ORAL
  Filled 2016-07-27: qty 1

## 2016-07-27 NOTE — ED Provider Notes (Signed)
[facility] Provider Note   CSN: [id number] Arrival date & time: 07/27/16  1227     History   Chief Complaint Chief Complaint  Patient presents with  . Wound Infection    HPI Perry Mercado is a 41 y.o. male with history of alcohol use and left thigh injury is coming in today for wound check.  Spanish interpreter was used. Patient states he has had continued small amount of drainage from the wound as well as itching around the wound and pain. He states it looks similar if not better than when he previously presented to the emergency department. Endorses daily cleaning with water and soap as well as changing his dressing. Patient reports finishing his outpatient course of antibiotics on Monday. She denies fevers, chills, nausea, vomiting, chest pain, shortness of breath, abdominal pain. Endorses some tingling around the wound but no tingling or numbness elsewhere in the left lower extremity. Tetanus was updated upon his initial presentation.  The history is provided by the patient. The history is limited by a language barrier. A language interpreter was used.    Past Medical History:  Diagnosis Date  . Medical history non-contributory     Patient Active Problem List   Diagnosis Date Noted  . Alcohol dependence (HCC) 06/03/2013    Past Surgical History:  Procedure Laterality Date  . NO PAST SURGERIES         Home Medications    Prior to Admission medications   Medication Sig Start Date End Date Taking? Authorizing Provider  acetaminophen (TYLENOL) 325 MG tablet Take 325-650 mg by mouth every 6 (six) hours as needed (for headaches).    Yes [provider]  clindamycin (CLEOCIN) 300 MG capsule Take 1 capsule (300 mg total) by mouth 3 (three) times daily. Patient not taking: Reported on 07/27/2016 07/15/16   Azalia Bilisampos, Kevin, MD  doxycycline (VIBRAMYCIN) 100 MG capsule Take 1 capsule (100 mg total) by mouth 2 (two) times daily. 07/27/16   Orson Slickolson, Audine Mangione, MD    erythromycin ophthalmic ointment Place a 1/2 inch ribbon of ointment into the lower eyelid 4 times daily for the next 5 days Patient not taking: Reported on 07/27/2016 04/21/16   Michela PitcherFawze, Mina A, PA-C  ranitidine (ZANTAC) 150 MG capsule Take 1 capsule (150 mg total) by mouth daily. Patient not taking: Reported on 07/27/2016 01/21/16   Nicanor AlconPalumbo, April, MD    Family History No family history on file.  Social History Social History  Substance Use Topics  . Smoking status: Current Every Day Smoker    Packs/day: 2.00    Types: Cigarettes  . Smokeless tobacco: Never Used  . Alcohol use Yes     Comment: weekly/ binge     Allergies   Patient has no known allergies.   Review of Systems Review of Systems  Constitutional: Negative for chills and fever.  HENT: Negative for ear pain and sore throat.   Eyes: Negative for pain and visual disturbance.  Respiratory: Negative for cough and shortness of breath.   Cardiovascular: Negative for chest pain and palpitations.  Gastrointestinal: Negative for abdominal pain and vomiting.  Genitourinary: Negative for dysuria and hematuria.  Musculoskeletal: Negative for arthralgias and back pain.  Skin: Positive for wound. Negative for rash.  Neurological: Negative for seizures and syncope.  Psychiatric/Behavioral: Negative for agitation and behavioral problems.  All other systems reviewed and are negative.    Physical Exam Updated Vital Signs BP 114/90   Pulse 79   Temp 98.6 F (37 C) (  Oral)   Resp 16   SpO2 100%   Physical Exam  Constitutional: He is oriented to person, place, and time. He appears well-developed and well-nourished.  HENT:  Head: Normocephalic.  Eyes: Conjunctivae are normal.  Neck: No tracheal deviation present.  Cardiovascular: Normal rate and intact distal pulses.   Pulmonary/Chest: Effort normal.  Abdominal: Soft.  Musculoskeletal: He exhibits tenderness. He exhibits no deformity.  Neurological: He is alert and  oriented to person, place, and time.  Skin: Skin is warm and dry.  4cm circumferential wound to left lateral thigh, no streaking. There is an area above it that is slightly erythematous and tender to palpation with some fluctuance, concern for abscess. There is bruising around the area as well that appears well healing and patient states it is getting better.  Psychiatric: He has a normal mood and affect.  Nursing note and vitals reviewed.    ED Treatments / Results  Labs (all labs ordered are listed, but only abnormal results are displayed) Labs Reviewed  COMPREHENSIVE METABOLIC PANEL - Abnormal; Notable for the following:       Result Value   AST 54 (*)    ALT 93 (*)    All other components within normal limits  CBC WITH DIFFERENTIAL/PLATELET  I-STAT CG4 LACTIC ACID, ED    EKG  EKG Interpretation None       Radiology Ct Femur Left Wo Contrast  Result Date: 07/27/2016 CLINICAL DATA:  Large wound in the left leg with hematoma to left lateral thigh. Nonhealing. To evaluate for foreign body. EXAM: CT OF THE LOWER LEFT EXTREMITY WITHOUT CONTRAST TECHNIQUE: Multidetector CT imaging of the lower left extremity was performed according to the standard protocol. COMPARISON:  None. FINDINGS: Bones/Joint/Cartilage Left hip and left femur appear intact. No evidence of acute fracture or dislocation. No focal bone lesions identified. No bone destruction. No evidence of osteomyelitis. Ligaments Suboptimally assessed by CT. Muscles and Tendons There is intramuscular edema demonstrated in the posterior compartment musculature of the thigh. No intramuscular mass or hematoma identified. Focal sliver of increased density demonstrated medial to the femoral shaft within the hip adductor muscles. This could represent ligamentous or vascular calcification. A foreign body is not entirely excluded. Soft tissues There is a focal ulceration in the posterior aspect of the left thigh extending to the posterior  compartment musculature. There is surrounding edema and infiltration consistent with cellulitis. No discrete abscess. No foreign bodies are demonstrated in the area of ulceration and cellulitis. IMPRESSION: 1. Focal ulceration in the posterior left thigh subcutaneous tissues with extension to the posterior compartment muscle layer. Associated soft tissue infiltration consistent with cellulitis. No abscess. No foreign bodies demonstrated in this area. 2. Intramuscular edema in the posterior compartment musculature of the thigh. 3. Focal sliver of increased density in the hip adductor muscles medial to the femoral shaft could represent soft tissue calcification or possibly foreign body. 4. No evidence of osteomyelitis. Electronically Signed   By: Burman NievesWilliam  Stevens M.D.   On: 07/27/2016 20:29   Dg Femur Min 2 Views Left  Result Date: 07/27/2016 CLINICAL DATA:  Impaled wound to left eye red and swollen EXAM: LEFT FEMUR 2 VIEWS COMPARISON:  None. FINDINGS: No fracture or malalignment. No periostitis or bone destruction. Soft tissue defect along the lateral aspect of mid thigh. No soft tissue gas IMPRESSION: 1. No acute osseous abnormality 2. Soft tissue defect lateral aspect of the midthigh presumably corresponding to history of puncture wound Electronically Signed   By: Selena BattenKim  Jake Samples M.D.   On: 07/27/2016 19:33    Procedures Procedures (including critical care time)  EMERGENCY DEPARTMENT US SOFT TISSUE INTERPRETATION "Study: Limited Soft Tissue Ultrasound"  INDICATIONS: Pain and Soft tissue infection Multiple views of the body part were obtained in real-time with a multi-frequency linear probe  PERFORMED BY: Myself IMAGES ARCHIVED?: Yes SIDE:Left BODY PART:Lower extremity and left thigh INTERPRETATION:  Abcess present, but likely hematoma    Medications Ordered in ED Medications  doxycycline (VIBRA-TABS) tablet 100 mg (100 mg Oral Given 07/27/16 2123)     Initial Impression / Assessment and  Plan / ED Course  I have reviewed the triage vital signs and the nursing notes.  Pertinent labs & imaging results that were available during my care of the patient were reviewed by me and considered in my medical decision making (see chart for details).     Patient presenting for wound check from incident that occurred a couple of weeks ago when he fell off the porch and had his leg impaled with a metal spike. Patient denies any fevers, chills, other systemic symptoms and lab work is reassuring here, however he does have an area of swelling and fluctuance proximal to the wound he states has been there, however he does appear slightly erythematous, and it is tender to palpation. Ultrasound showed concern for an abscess/hematoma. The wound itself does appear to be well-healing, Although there is a large defect that is unlikely to completely heal and now a large hematoma developing that is tracking more proximal.   CT imaging was obtained that showed concern for it tracking into the muscle. Orthopedic surgery was consulted, and they will evaluate him on Monday. He was placed on doxycycline and given one course here in the emergency department. Patient has no leukocytosis and no fever with stable vital signs, and I do think he is safe for outpatient management with follow-up on Monday. I told the patient several times about the importance of completing his antibiotic course and following up as an outpatient. He voiced understanding and agreement to the plan and was comfortable with discharge home with outpatient follow-up.  Patient was seen with my attending, Dr. Erma Heritage, who voiced agreement and oversaw the evaluation and treatment of this patient.   Dragon [employer] illustrator was used in the creation of this note. If there are any errors or inconsistencies needing clarification, please contact me directly.   Final Clinical Impressions(s) / ED Diagnoses   Final diagnoses:  Wound abscess  Wound  infection    New Prescriptions Discharge Medication List as of 07/27/2016  9:11 PM    START taking these medications   Details  doxycycline (VIBRAMYCIN) 100 MG capsule Take 1 capsule (100 mg total) by mouth 2 (two) times daily., Starting Fri 07/27/2016, Print         Orson Slick, MD 07/28/16 1610    Shaune Pollack, MD 07/28/16 [phone]

## 2016-07-27 NOTE — ED Notes (Signed)
Pt given sandwich meal and Sprite, per request.

## 2016-07-27 NOTE — ED Notes (Signed)
Patient transported to CT 

## 2016-07-27 NOTE — ED Triage Notes (Signed)
Pt was seen here on 7/15 for a wound to his left thigh, returns to say that area is red swollen and tender to touch ,  X 4 days , wants dressings has a lot of bruising to both legs, all info from  start[employer] interpreter machine

## 2016-07-27 NOTE — ED Notes (Signed)
Pt departed in NAD, refused use of wheelchair.  

## 2017-09-12 ENCOUNTER — Other Ambulatory Visit (HOSPITAL_COMMUNITY): Payer: Self-pay | Admitting: Emergency Medicine

## 2017-09-12 ENCOUNTER — Encounter (HOSPITAL_COMMUNITY): Payer: Self-pay

## 2017-09-12 ENCOUNTER — Ambulatory Visit (HOSPITAL_COMMUNITY)
Admission: RE | Admit: 2017-09-12 | Discharge: 2017-09-12 | Disposition: A | Discharge status: Home / Self Care | Payer: Self-pay | Source: Ambulatory Visit | Attending: Emergency Medicine | Admitting: Emergency Medicine

## 2017-09-12 ENCOUNTER — Emergency Department (HOSPITAL_COMMUNITY)
Admission: EM | Admit: 2017-09-12 | Discharge: 2017-09-12 | Disposition: A | Discharge status: Home / Self Care | Payer: Self-pay | Attending: Emergency Medicine | Admitting: Emergency Medicine

## 2017-09-12 DIAGNOSIS — T1490XA Injury, unspecified, initial encounter: Secondary | ICD-10-CM | POA: Insufficient documentation

## 2017-09-12 DIAGNOSIS — Y999 Unspecified external cause status: Secondary | ICD-10-CM | POA: Insufficient documentation

## 2017-09-12 DIAGNOSIS — Y929 Unspecified place or not applicable: Secondary | ICD-10-CM | POA: Insufficient documentation

## 2017-09-12 DIAGNOSIS — X58XXXA Exposure to other specified factors, initial encounter: Secondary | ICD-10-CM | POA: Insufficient documentation

## 2017-09-12 DIAGNOSIS — F10929 Alcohol use, unspecified with intoxication, unspecified: Secondary | ICD-10-CM | POA: Insufficient documentation

## 2017-09-12 DIAGNOSIS — F10920 Alcohol use, unspecified with intoxication, uncomplicated: Secondary | ICD-10-CM

## 2017-09-12 DIAGNOSIS — R930 Abnormal findings on diagnostic imaging of skull and head, not elsewhere classified: Secondary | ICD-10-CM | POA: Insufficient documentation

## 2017-09-12 DIAGNOSIS — S0083XA Contusion of other part of head, initial encounter: Secondary | ICD-10-CM

## 2017-09-12 DIAGNOSIS — S01411A Laceration without foreign body of right cheek and temporomandibular area, initial encounter: Secondary | ICD-10-CM | POA: Insufficient documentation

## 2017-09-12 DIAGNOSIS — Y939 Activity, unspecified: Secondary | ICD-10-CM | POA: Insufficient documentation

## 2017-09-12 NOTE — ED Provider Notes (Signed)
See Epic downtime documentation.   Austen Wygant, Jonny RuizJohn, MD 09/12/17 [phone]

## 2017-09-16 LAB — ETHANOL: ALCOHOL ETHYL (B): 323 mg/dL — AB (ref <10)

## 2017-12-08 ENCOUNTER — Encounter (HOSPITAL_COMMUNITY): Payer: Self-pay

## 2017-12-08 DIAGNOSIS — R1031 Right lower quadrant pain: Secondary | ICD-10-CM | POA: Insufficient documentation

## 2017-12-08 DIAGNOSIS — Z5321 Procedure and treatment not carried out due to patient leaving prior to being seen by health care provider: Secondary | ICD-10-CM | POA: Insufficient documentation

## 2017-12-08 NOTE — ED Notes (Signed)
I called patient to collect labs he is standing in front of the door in the lobby.  I walked over to ask if I could collect his labs and he stated no he was waiting on his taxi.   I made the nurse aware.

## 2017-12-08 NOTE — ED Triage Notes (Addendum)
Pt BIB EMS from [address]. Pt called in for R flank pain that he says came from a fall at work 4 days ago. Pt states he has had trouble walking. Pt intoxicated.   Vitals BP 118/80 P 88 SpO2 98% RR 18 CBG 113

## 2017-12-09 ENCOUNTER — Emergency Department (HOSPITAL_COMMUNITY)
Admission: EM | Admit: 2017-12-09 | Discharge: 2017-12-09 | Disposition: A | Discharge status: Home / Self Care | Payer: Self-pay | Attending: Emergency Medicine | Admitting: Emergency Medicine

## 2017-12-09 NOTE — ED Notes (Signed)
Pt called for room placement. No in lobby. Pt told staff that he was getting a taxi.

## 2018-04-11 ENCOUNTER — Emergency Department (HOSPITAL_COMMUNITY)
Admission: EM | Admit: 2018-04-11 | Discharge: 2018-04-11 | Disposition: A | Discharge status: Home / Self Care | Payer: Self-pay | Attending: Emergency Medicine | Admitting: Emergency Medicine

## 2018-04-11 ENCOUNTER — Emergency Department (HOSPITAL_COMMUNITY): Payer: Self-pay

## 2018-04-11 ENCOUNTER — Encounter (HOSPITAL_COMMUNITY): Payer: Self-pay

## 2018-04-11 ENCOUNTER — Other Ambulatory Visit: Payer: Self-pay

## 2018-04-11 DIAGNOSIS — R0789 Other chest pain: Secondary | ICD-10-CM

## 2018-04-11 DIAGNOSIS — K2920 Alcoholic gastritis without bleeding: Secondary | ICD-10-CM

## 2018-04-11 DIAGNOSIS — F10920 Alcohol use, unspecified with intoxication, uncomplicated: Secondary | ICD-10-CM

## 2018-04-11 DIAGNOSIS — F17210 Nicotine dependence, cigarettes, uncomplicated: Secondary | ICD-10-CM | POA: Insufficient documentation

## 2018-04-11 LAB — CBC WITH DIFFERENTIAL/PLATELET
Abs Immature Granulocytes: 0.02 10*3/uL (ref 0.00–0.07)
Basophils Absolute: 0 10*3/uL (ref 0.0–0.1)
Basophils Relative: 0 %
Eosinophils Absolute: 0 10*3/uL (ref 0.0–0.5)
Eosinophils Relative: 1 %
HCT: 46.1 % (ref 39.0–52.0)
Hemoglobin: 15.7 g/dL (ref 13.0–17.0)
Immature Granulocytes: 0 %
Lymphocytes Relative: 46 %
Lymphs Abs: 2.4 10*3/uL (ref 0.7–4.0)
MCH: 31.7 pg (ref 26.0–34.0)
MCHC: 34.1 g/dL (ref 30.0–36.0)
MCV: 93.1 fL (ref 80.0–100.0)
Monocytes Absolute: 0.3 10*3/uL (ref 0.1–1.0)
Monocytes Relative: 5 %
Neutro Abs: 2.5 10*3/uL (ref 1.7–7.7)
Neutrophils Relative %: 48 %
Platelets: 197 10*3/uL (ref 150–400)
RBC: 4.95 MIL/uL (ref 4.22–5.81)
RDW: 12.2 % (ref 11.5–15.5)
WBC: 5.2 10*3/uL (ref 4.0–10.5)
nRBC: 0 % (ref 0.0–0.2)

## 2018-04-11 LAB — COMPREHENSIVE METABOLIC PANEL
ALT: 42 U/L (ref 0–44)
AST: 43 U/L — ABNORMAL HIGH (ref 15–41)
Albumin: 4.6 g/dL (ref 3.5–5.0)
Alkaline Phosphatase: 78 U/L (ref 38–126)
Anion gap: 11 (ref 5–15)
BUN: 9 mg/dL (ref 6–20)
CO2: 22 mmol/L (ref 22–32)
Calcium: 8.9 mg/dL (ref 8.9–10.3)
Chloride: 110 mmol/L (ref 98–111)
Creatinine, Ser: 0.67 mg/dL (ref 0.61–1.24)
GFR calc Af Amer: >60 mL/min (ref >60)
GFR calc non Af Amer: >60 mL/min (ref >60)
Glucose, Bld: 110 mg/dL — ABNORMAL HIGH (ref 70–99)
Potassium: 4 mmol/L (ref 3.5–5.1)
Sodium: 143 mmol/L (ref 135–145)
Total Bilirubin: 0.7 mg/dL (ref 0.3–1.2)
Total Protein: 7.9 g/dL (ref 6.5–8.1)

## 2018-04-11 LAB — ETHANOL: Alcohol, Ethyl (B): 346 mg/dL (ref <10)

## 2018-04-11 LAB — LIPASE, BLOOD: Lipase: 123 U/L — ABNORMAL HIGH (ref 11–51)

## 2018-04-11 LAB — TROPONIN I: Troponin I: <0.03 ng/mL (ref <0.03)

## 2018-04-11 MED ORDER — ALUM & MAG HYDROXIDE-SIMETH 200-200-20 MG/5ML PO SUSP
30.0000 mL | Freq: Once | ORAL | Status: AC
  Administered 2018-04-11: 21:00:00 30 mL via ORAL
  Filled 2018-04-11: qty 30

## 2018-04-11 MED ORDER — SODIUM CHLORIDE 0.9 % IV BOLUS: 1000.0000 mL | Freq: Once | INTRAVENOUS | Status: DC

## 2018-04-11 NOTE — ED Notes (Signed)
Patient ambulated full length of hall with no assist-Dr. Erma Heritage aware-patient given sandwich and sprite

## 2018-04-11 NOTE — ED Triage Notes (Signed)
Patient arrives by [facility]-patient called 911 for unknown reason-patient is intoxicated.

## 2018-04-11 NOTE — ED Notes (Signed)
PT DISCHARGED. INSTRUCTIONS GIVEN. AAOX4. PT IN NO APPARENT DISTRESS OR PAIN. PT STS HE IS MISSING HIS CIGARETTES AND MONEY. PT ONLY HAD CLOTHES, SHOES, AND A CELL PHONE UPON ARRIVAL. THE OPPORTUNITY TO ASK QUESTIONS WAS PROVIDED.

## 2018-04-11 NOTE — ED Provider Notes (Signed)
[facility]-EMERGENCY DEPT Provider Note   CSN: [id number] Arrival date & time: 04/11/18  2011    History   Chief Complaint Chief Complaint  Patient presents with  . Alcohol Intoxication  . Chest Pain    HPI Perry Mercado is a 43 y.o. male.     HPI   43 yo M with PMHx alcoholism here w/ multiple complaints. History somewhat limited 2/2 intoxication. However, he reports that he is here because every time he drinks, he develops "chest pain that shoots to his head." He states it lasts 2-3 days w/ nausea, epigastric discomfort then resolves. He states he only dirnks on weekends but has been drinking heavily today. He told EMS he did not know why he called 911. Denies any SOB. No fevers. No recent travel. Remainder of history limited 2/2 intoxication. Video interpreter used.  Level 5 caveat invoked as remainder of history, ROS, and physical exam limited due to patient's intoxication.   Past Medical History:  Diagnosis Date  . Medical history non-contributory     Patient Active Problem List   Diagnosis Date Noted  . Alcohol dependence (HCC) 06/03/2013    Past Surgical History:  Procedure Laterality Date  . NO PAST SURGERIES          Home Medications    Prior to Admission medications   Not on File    Family History History reviewed. No pertinent family history.  Social History Social History   Tobacco Use  . Smoking status: Current Every Day Smoker    Packs/day: 2.00    Types: Cigarettes  . Smokeless tobacco: Never Used  Substance Use Topics  . Alcohol use: Yes    Comment: weekly/ binge  . Drug use: Yes    Types: Marijuana     Allergies   Patient has no known allergies.   Review of Systems Review of Systems  Unable to perform ROS: Mental status change     Physical Exam Updated Vital Signs BP 122/79   Pulse 83   Temp 98.1 F (36.7 C)   Resp 20   Ht 5\' 9"  (1.753 m)   Wt 81.6 kg   SpO2 99%   BMI 26.58 kg/m    Physical Exam Vitals signs and nursing note reviewed.  Constitutional:      General: He is not in acute distress.    Appearance: He is well-developed.     Comments: Smells of alcohol  HENT:     Head: Normocephalic and atraumatic.  Eyes:     Conjunctiva/sclera: Conjunctivae normal.  Neck:     Musculoskeletal: Neck supple.  Cardiovascular:     Rate and Rhythm: Normal rate and regular rhythm.     Heart sounds: Normal heart sounds. No murmur. No friction rub.  Pulmonary:     Effort: Pulmonary effort is normal. No respiratory distress.     Breath sounds: Normal breath sounds. No wheezing or rales.  Abdominal:     General: There is no distension.     Palpations: Abdomen is soft.     Tenderness: There is no abdominal tenderness.     Comments: Minimal epigastric TTP. No rebound or guarding.  Skin:    General: Skin is warm.     Capillary Refill: Capillary refill takes less than 2 seconds.  Neurological:     Mental Status: He is alert and oriented to person, place, and time.     Motor: No abnormal muscle tone.      ED Treatments /  Results  Labs (all labs ordered are listed, but only abnormal results are displayed) Labs Reviewed  COMPREHENSIVE METABOLIC PANEL - Abnormal; Notable for the following components:      Result Value   Glucose, Bld 110 (*)    AST 43 (*)    All other components within normal limits  LIPASE, BLOOD - Abnormal; Notable for the following components:   Lipase 123 (*)    All other components within normal limits  ETHANOL - Abnormal; Notable for the following components:   Alcohol, Ethyl (B) 346 (*)    All other components within normal limits  CBC WITH DIFFERENTIAL/PLATELET  TROPONIN I    EKG EKG Interpretation  Date/Time:  Friday April 11 2018 20:28:43 EDT Ventricular Rate:  82 PR Interval:    QRS Duration: 99 QT Interval:  375 QTC Calculation: 438 R Axis:   45 Text Interpretation:  Sinus rhythm ST elev, probable normal early repol pattern No  old tracing to compare Confirmed by Shaune Pollack [phone]) on 04/11/2018 8:40:31 PM   Radiology Dg Chest 2 View  Result Date: 04/11/2018 CLINICAL DATA:  Chest discomfort. EXAM: CHEST - 2 VIEW COMPARISON:  Radiographs of January 21, 2016. FINDINGS: The heart size and mediastinal contours are within normal limits. Both lungs are clear. No pneumothorax or pleural effusion is noted. The visualized skeletal structures are unremarkable. IMPRESSION: No active cardiopulmonary disease. Electronically Signed   By: Lupita Raider, M.D.   On: 04/11/2018 21:20    Procedures Procedures (including critical care time)  Medications Ordered in ED Medications  alum & mag hydroxide-simeth (MAALOX/MYLANTA) 200-200-20 MG/5ML suspension 30 mL (30 mLs Oral Given 04/11/18 2055)     Initial Impression / Assessment and Plan / ED Course  I have reviewed the triage vital signs and the nursing notes.  Pertinent labs & imaging results that were available during my care of the patient were reviewed by me and considered in my medical decision making (see chart for details).        43 year old male here with acute alcohol intoxication and multiple complaints.  Regarding his reported pain I suspect this is alcoholic gastritis with possible component of chronic alcoholic pancreatitis.  Lipase mildly elevated, but lab work is otherwise reassuring.  He has a normal white blood cell count.  Normal hemoglobin.  No evidence to suggest hemorrhagic or necrotic pancreatitis.  This is a known issue for him.  He feels markedly improved in the ED and is tolerating p.o.  Will allow him to sober, reassess once sober, and likely discharge with antacids and pancreatitis diet. EKG neg, normal trop, doubt ACS.  Pt sobering appropriately in ED. He's been able to tolerate PO and eat a small amount. He is ambulatory. Explained findings, will d/c with outpt follow-up/resources, antacids.  Final Clinical Impressions(s) / ED Diagnoses    Final diagnoses:  Alcoholic intoxication without complication (HCC)  Chronic alcoholic gastritis without hemorrhage    ED Discharge Orders    None       Shaune Pollack, MD 04/11/18 2307

## 2018-04-11 NOTE — ED Notes (Signed)
Patient given written and verbal discharge instructions using [place] per Koleen Nimrod RN-patient escorted to bus stop by security

## 2018-04-11 NOTE — ED Notes (Signed)
Bed: WA17 Expected date:  Expected time:  Means of arrival:  Comments: EMS 43 yo male limited English-intoxicated/vomiting/incontinent

## 2018-04-11 NOTE — ED Notes (Signed)
Pt sts his heart feels like it is going to stop with a headache every time he drinks beer. Pt admits to drinking 3 beers today.
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
## 2018-06-22 ENCOUNTER — Other Ambulatory Visit: Payer: Self-pay

## 2018-06-22 ENCOUNTER — Emergency Department (HOSPITAL_COMMUNITY)
Admission: EM | Admit: 2018-06-22 | Discharge: 2018-06-22 | Disposition: A | Discharge status: Home / Self Care | Payer: Self-pay | Attending: Emergency Medicine | Admitting: Emergency Medicine

## 2018-06-22 DIAGNOSIS — F17210 Nicotine dependence, cigarettes, uncomplicated: Secondary | ICD-10-CM | POA: Insufficient documentation

## 2018-06-22 DIAGNOSIS — F10920 Alcohol use, unspecified with intoxication, uncomplicated: Secondary | ICD-10-CM | POA: Insufficient documentation

## 2018-06-22 LAB — CBC WITH DIFFERENTIAL/PLATELET
Abs Immature Granulocytes: 0.02 10*3/uL (ref 0.00–0.07)
Basophils Absolute: 0 10*3/uL (ref 0.0–0.1)
Basophils Relative: 0 %
Eosinophils Absolute: 0 10*3/uL (ref 0.0–0.5)
Eosinophils Relative: 1 %
HCT: 43 % (ref 39.0–52.0)
Hemoglobin: 14.5 g/dL (ref 13.0–17.0)
Immature Granulocytes: 0 %
Lymphocytes Relative: 38 %
Lymphs Abs: 2.5 10*3/uL (ref 0.7–4.0)
MCH: 31.5 pg (ref 26.0–34.0)
MCHC: 33.7 g/dL (ref 30.0–36.0)
MCV: 93.5 fL (ref 80.0–100.0)
Monocytes Absolute: 0.4 10*3/uL (ref 0.1–1.0)
Monocytes Relative: 6 %
Neutro Abs: 3.6 10*3/uL (ref 1.7–7.7)
Neutrophils Relative %: 55 %
Platelets: 197 10*3/uL (ref 150–400)
RBC: 4.6 MIL/uL (ref 4.22–5.81)
RDW: 13.1 % (ref 11.5–15.5)
WBC: 6.5 10*3/uL (ref 4.0–10.5)
nRBC: 0 % (ref 0.0–0.2)

## 2018-06-22 LAB — COMPREHENSIVE METABOLIC PANEL
ALT: 34 U/L (ref 0–44)
AST: 36 U/L (ref 15–41)
Albumin: 4.5 g/dL (ref 3.5–5.0)
Alkaline Phosphatase: 70 U/L (ref 38–126)
Anion gap: 15 (ref 5–15)
BUN: 9 mg/dL (ref 6–20)
CO2: 17 mmol/L — ABNORMAL LOW (ref 22–32)
Calcium: 8.6 mg/dL — ABNORMAL LOW (ref 8.9–10.3)
Chloride: 102 mmol/L (ref 98–111)
Creatinine, Ser: 0.7 mg/dL (ref 0.61–1.24)
GFR calc Af Amer: >60 mL/min (ref >60)
GFR calc non Af Amer: >60 mL/min (ref >60)
Glucose, Bld: 113 mg/dL — ABNORMAL HIGH (ref 70–99)
Potassium: 3.2 mmol/L — ABNORMAL LOW (ref 3.5–5.1)
Sodium: 134 mmol/L — ABNORMAL LOW (ref 135–145)
Total Bilirubin: 0.7 mg/dL (ref 0.3–1.2)
Total Protein: 7.2 g/dL (ref 6.5–8.1)

## 2018-06-22 LAB — RAPID URINE DRUG SCREEN, HOSP PERFORMED
Amphetamines: NOT DETECTED
Barbiturates: NOT DETECTED
Benzodiazepines: NOT DETECTED
Cocaine: NOT DETECTED
Opiates: NOT DETECTED
Tetrahydrocannabinol: NOT DETECTED

## 2018-06-22 LAB — LIPASE, BLOOD: Lipase: 49 U/L (ref 11–51)

## 2018-06-22 LAB — ETHANOL: Alcohol, Ethyl (B): 352 mg/dL (ref <10)

## 2018-06-22 LAB — TROPONIN I: Troponin I: <0.03 ng/mL (ref <0.03)

## 2018-06-22 MED ORDER — SODIUM CHLORIDE 0.9 % IV BOLUS
1000.0000 mL | Freq: Once | INTRAVENOUS | Status: AC
  Administered 2018-06-22: 1000 mL via INTRAVENOUS

## 2018-06-22 MED ORDER — ADULT MULTIVITAMIN W/MINERALS CH
1.0000 | ORAL_TABLET | Freq: Once | ORAL | Status: AC
  Administered 2018-06-22: 1 via ORAL
  Filled 2018-06-22: qty 1

## 2018-06-22 MED ORDER — THIAMINE HCL 100 MG/ML IJ SOLN
100.0000 mg | Freq: Once | INTRAMUSCULAR | Status: AC
  Administered 2018-06-22: 100 mg via INTRAVENOUS
  Filled 2018-06-22: qty 2

## 2018-06-22 NOTE — ED Notes (Signed)
Bed: [facility]PT3 Expected date:  Expected time:  Means of arrival:  Comments: 

## 2018-06-22 NOTE — ED Triage Notes (Signed)
Pt presents by [facility] for alcohol intoxication all pt would state is abdominal pain.

## 2018-06-24 NOTE — ED Provider Notes (Signed)
[facility]-EMERGENCY DEPT Provider Note   CSN: [id number] Arrival date & time: 06/22/18  0146    History   Chief Complaint Chief Complaint  Patient presents with  . Alcohol Intoxication    HPI Perry Mercado is a 43 y.o. male.     Patient presents to the emergency department for evaluation of alcohol intoxication.  Patient reported epigastric abdominal discomfort as well.  No nausea, vomiting or diarrhea.     Past Medical History:  Diagnosis Date  . Medical history non-contributory     Patient Active Problem List   Diagnosis Date Noted  . Alcohol dependence (HCC) 06/03/2013    Past Surgical History:  Procedure Laterality Date  . NO PAST SURGERIES          Home Medications    Prior to Admission medications   Not on File    Family History No family history on file.  Social History Social History   Tobacco Use  . Smoking status: Current Every Day Smoker    Packs/day: 2.00    Types: Cigarettes  . Smokeless tobacco: Never Used  Substance Use Topics  . Alcohol use: Yes    Comment: weekly/ binge  . Drug use: Yes    Types: Marijuana     Allergies   Patient has no known allergies.   Review of Systems Review of Systems  Gastrointestinal: Positive for abdominal pain.  All other systems reviewed and are negative.    Physical Exam Updated Vital Signs BP 124/73 (BP Location: Left Arm)   Pulse 71   Temp 98.6 F (37 C) (Oral)   Resp 16   Ht 5\' 6"  (1.676 m)   Wt 81 kg   SpO2 99%   BMI 28.82 kg/m   Physical Exam Vitals signs and nursing note reviewed.  Constitutional:      General: He is not in acute distress.    Appearance: Normal appearance. He is well-developed.  HENT:     Head: Normocephalic and atraumatic.     Right Ear: Hearing normal.     Left Ear: Hearing normal.     Nose: Nose normal.  Eyes:     Conjunctiva/sclera: Conjunctivae normal.     Pupils: Pupils are equal, round, and reactive to light.  Neck:      Musculoskeletal: Normal range of motion and neck supple.  Cardiovascular:     Rate and Rhythm: Regular rhythm.     Heart sounds: S1 normal and S2 normal. No murmur. No friction rub. No gallop.   Pulmonary:     Effort: Pulmonary effort is normal. No respiratory distress.     Breath sounds: Normal breath sounds.  Chest:     Chest wall: No tenderness.  Abdominal:     General: Bowel sounds are normal.     Palpations: Abdomen is soft.     Tenderness: There is abdominal tenderness in the epigastric area. There is no guarding or rebound. Negative signs include Murphy's sign and McBurney's sign.     Hernia: No hernia is present.  Musculoskeletal: Normal range of motion.  Skin:    General: Skin is warm and dry.     Findings: No rash.  Neurological:     Mental Status: He is alert and oriented to person, place, and time.     GCS: GCS eye subscore is 4. GCS verbal subscore is 5. GCS motor subscore is 6.     Cranial Nerves: No cranial nerve deficit.     Sensory: No  sensory deficit.     Coordination: Coordination normal.  Psychiatric:        Speech: Speech normal.        Behavior: Behavior normal.        Thought Content: Thought content normal.      ED Treatments / Results  Labs (all labs ordered are listed, but only abnormal results are displayed) Labs Reviewed  COMPREHENSIVE METABOLIC PANEL - Abnormal; Notable for the following components:      Result Value   Sodium 134 (*)    Potassium 3.2 (*)    CO2 17 (*)    Glucose, Bld 113 (*)    Calcium 8.6 (*)    All other components within normal limits  ETHANOL - Abnormal; Notable for the following components:   Alcohol, Ethyl (B) 352 (*)    All other components within normal limits  CBC WITH DIFFERENTIAL/PLATELET  TROPONIN I  RAPID URINE DRUG SCREEN, HOSP PERFORMED  LIPASE, BLOOD    EKG EKG Interpretation  Date/Time:  Sunday June 22 2018 02:12:36 EDT Ventricular Rate:  73 PR Interval:    QRS Duration: 107 QT Interval:   406 QTC Calculation: 448 R Axis:   15 Text Interpretation:  Sinus rhythm ST elev, probable normal early repol pattern No significant change since last tracing Confirmed by Orpah Greek [phone]) on 06/22/2018 3:55:52 AM Also confirmed by Orpah Greek [phone]), editor Hattie Perch ([id number])  on 06/23/2018 4:49:14 PM   Radiology No results found.  Procedures Procedures (including critical care time)  Medications Ordered in ED Medications  sodium chloride 0.9 % bolus 1,000 mL (0 mLs Intravenous Stopped 06/22/18 0405)  thiamine (B-1) injection 100 mg (100 mg Intravenous Given 06/22/18 0308)  multivitamin with minerals tablet 1 tablet (1 tablet Oral Given 06/22/18 0306)     Initial Impression / Assessment and Plan / ED Course  I have reviewed the triage vital signs and the nursing notes.  Pertinent labs & imaging results that were available during my care of the patient were reviewed by me and considered in my medical decision making (see chart for details).        Patient presents with alcohol intoxication.  He was complaining of slight epigastric discomfort at arrival.  Examination revealed mild epigastric tenderness but no guarding or rebound.  No clinical concern for acute surgical process.  Work-up unremarkable other than alcohol level of 354.  Patient monitored through the night, has slowly sobered up and is now without complaints.  Appropriate for discharge.  Final Clinical Impressions(s) / ED Diagnoses   Final diagnoses:  Alcoholic intoxication without complication [facility])    ED Discharge Orders    None       Orpah Greek, MD 06/24/18 [phone]

## 2019-02-02 ENCOUNTER — Emergency Department (HOSPITAL_COMMUNITY)
Admission: EM | Admit: 2019-02-02 | Discharge: 2019-02-02 | Disposition: A | Discharge status: Home / Self Care | Payer: Self-pay | Attending: Emergency Medicine | Admitting: Emergency Medicine

## 2019-02-02 ENCOUNTER — Emergency Department (HOSPITAL_COMMUNITY): Payer: Self-pay

## 2019-02-02 ENCOUNTER — Encounter (HOSPITAL_COMMUNITY): Payer: Self-pay

## 2019-02-02 ENCOUNTER — Other Ambulatory Visit: Payer: Self-pay

## 2019-02-02 DIAGNOSIS — Z20822 Contact with and (suspected) exposure to covid-19: Secondary | ICD-10-CM | POA: Insufficient documentation

## 2019-02-02 DIAGNOSIS — R52 Pain, unspecified: Secondary | ICD-10-CM

## 2019-02-02 DIAGNOSIS — M7918 Myalgia, other site: Secondary | ICD-10-CM | POA: Insufficient documentation

## 2019-02-02 DIAGNOSIS — F17210 Nicotine dependence, cigarettes, uncomplicated: Secondary | ICD-10-CM | POA: Insufficient documentation

## 2019-02-02 LAB — COMPREHENSIVE METABOLIC PANEL
ALT: 41 U/L (ref 0–44)
AST: 34 U/L (ref 15–41)
Albumin: 4.1 g/dL (ref 3.5–5.0)
Alkaline Phosphatase: 75 U/L (ref 38–126)
Anion gap: 12 (ref 5–15)
BUN: 16 mg/dL (ref 6–20)
CO2: 23 mmol/L (ref 22–32)
Calcium: 9.5 mg/dL (ref 8.9–10.3)
Chloride: 103 mmol/L (ref 98–111)
Creatinine, Ser: 1.01 mg/dL (ref 0.61–1.24)
GFR calc Af Amer: >60 mL/min (ref >60)
GFR calc non Af Amer: >60 mL/min (ref >60)
Glucose, Bld: 95 mg/dL (ref 70–99)
Potassium: 4.3 mmol/L (ref 3.5–5.1)
Sodium: 138 mmol/L (ref 135–145)
Total Bilirubin: 1 mg/dL (ref 0.3–1.2)
Total Protein: 6.6 g/dL (ref 6.5–8.1)

## 2019-02-02 LAB — CBC WITH DIFFERENTIAL/PLATELET
Abs Immature Granulocytes: 0.02 10*3/uL (ref 0.00–0.07)
Basophils Absolute: 0 10*3/uL (ref 0.0–0.1)
Basophils Relative: 0 %
Eosinophils Absolute: 0.1 10*3/uL (ref 0.0–0.5)
Eosinophils Relative: 1 %
HCT: 44.2 % (ref 39.0–52.0)
Hemoglobin: 14.7 g/dL (ref 13.0–17.0)
Immature Granulocytes: 0 %
Lymphocytes Relative: 38 %
Lymphs Abs: 2 10*3/uL (ref 0.7–4.0)
MCH: 29.4 pg (ref 26.0–34.0)
MCHC: 33.3 g/dL (ref 30.0–36.0)
MCV: 88.4 fL (ref 80.0–100.0)
Monocytes Absolute: 0.4 10*3/uL (ref 0.1–1.0)
Monocytes Relative: 7 %
Neutro Abs: 2.8 10*3/uL (ref 1.7–7.7)
Neutrophils Relative %: 54 %
Platelets: 203 10*3/uL (ref 150–400)
RBC: 5 MIL/uL (ref 4.22–5.81)
RDW: 12.1 % (ref 11.5–15.5)
WBC: 5.3 10*3/uL (ref 4.0–10.5)
nRBC: 0 % (ref 0.0–0.2)

## 2019-02-02 LAB — SARS CORONAVIRUS 2 (TAT 6-24 HRS): SARS Coronavirus 2: NEGATIVE

## 2019-02-02 LAB — ETHANOL: Alcohol, Ethyl (B): <10 mg/dL (ref <10)

## 2019-02-02 LAB — CK: Total CK: 131 U/L (ref 49–397)

## 2019-02-02 LAB — POC SARS CORONAVIRUS 2 AG -  ED: SARS Coronavirus 2 Ag: NEGATIVE

## 2019-02-02 MED ORDER — SODIUM CHLORIDE 0.9 % IV BOLUS
500.0000 mL | Freq: Once | INTRAVENOUS | Status: AC
  Administered 2019-02-02: 13:00:00 500 mL via INTRAVENOUS

## 2019-02-02 MED ORDER — KETOROLAC TROMETHAMINE 15 MG/ML IJ SOLN
15.0000 mg | Freq: Once | INTRAMUSCULAR | Status: AC
  Administered 2019-02-02: 15:00:00 15 mg via INTRAMUSCULAR
  Filled 2019-02-02: qty 1

## 2019-02-02 NOTE — ED Notes (Signed)
Pt discharge instructions given, pt verbalized understanding of need to quarantine until covid results have come back.

## 2019-02-02 NOTE — Discharge Instructions (Signed)
You have been diagnosed today with Body Aches.  At this time there does not appear to be the presence of an emergent medical condition, however there is always the potential for conditions to change. Please read and follow the below instructions.  Please return to the Emergency Department immediately for any new or worsening symptoms. Please be sure to follow up with your Primary Care Provider within one week regarding your visit today; please call their office to schedule an appointment even if you are feeling better for a follow-up visit.  Call the phone number at [facility] health community health and wellness to establish a primary care doctor for follow-up. You have been given an NSAID-containing medication called Toradol today.  Do not take the medications including ibuprofen, Aleve, Advil, naproxen or other NSAID-containing medications for the next 2 days.  Please be sure to drink plenty of water over the next few days. Your COVID test is currently in process. You may view your results on your MyChart account in the next 1-2 days. Continue to quarantine until your results are available.  There is a possibility of a false negative test so please continue to quarantine until you are symptom-free for 7 days. Please drink plenty of water and get plenty of rest.  Get help right away if: You have trouble breathing. You have trouble swallowing. You have muscle pain along with a stiff neck, fever, and vomiting. You have severe muscle weakness or cannot move part of your body. You have any new/concerning or worsening of symptoms   Please read the additional information packets attached to your discharge summary.  Do not take your medicine if  develop an itchy rash, swelling in your mouth or lips, or difficulty breathing; call 911 and seek immediate emergency medical attention if this occurs.  Note: Portions of this text may have been transcribed using voice recognition software. Every effort was made to  ensure accuracy; however, inadvertent computerized transcription errors may still be present. ----------- Below has been translated using Google translate.  Errors may be present.  Interpret with caution.  A continuacin se ha traducido con Soil scientist. Puede haber errores. Interprete con precaucin. ---------- Perry Mercado le han diagnosticado dolores corporales.  En este momento no parece haber la presencia de una afeccin mdica emergente, sin embargo, siempre existe la posibilidad de que las afecciones [place]. Lea y siga las instrucciones a continuacin.  1. Regrese al Departamento de Emergencias de inmediato si tiene sntomas nuevos o que [place]. 2. Asegrese de hacer un seguimiento con su Proveedor de atencin primaria dentro de una semana con respecto a su visita de hoy; por favor llame a su oficina para programar una cita incluso si se siente mejor para una visita de seguimiento. Llame al nmero de telfono de [facility] para establecer un mdico de atencin primaria para [employer]. 3. Hoy le han dado un medicamento que contiene AINE llamado Toradol. No tome los medicamentos que incluyen ibuprofeno, Aleve, Advil, naproxeno u otros medicamentos que contengan [employer] prximos 2 [address]  [address]. Asegrese de beber [employer] prximos [place]. 4. Su prueba de COVID est actualmente en proceso. Puede ver sus resultados en su cuenta MyChart en los prximos 1-2 das. Contine en cuarentena hasta que sus resultados estn disponibles. Existe la posibilidad de una prueba negativa falsa, as que contine en cuarentena hasta que est libre de sntomas durante [address]. 5. Perry Mercado agua y descanse lo suficiente.  Obtenga ayuda de  inmediato si: ? Tiene dificultad para respirar. ? Tiene dificultad para tragar. ? Tiene dolor muscular junto con rigidez en el cuello, fiebre y vmitos. ? Tiene debilidad muscular severa o no puede mover parte de su cuerpo. ?  Tiene sntomas nuevos / preocupantes o que empeoran  Lea los paquetes de informacin adicional adjuntos a su resumen de alta.  No tome su medicamento si desarrolla un sarpullido con picazn, hinchazn en su boca o labios, o dificultad para respirar; Llame al 911 y busque atencin mdica de emergencia inmediata si esto ocurre.  Nota: Es posible que algunas partes de este texto se hayan transcrito con un software de reconocimiento de voz. Se hizo todo lo posible para garantizar la precisin; sin embargo, an pueden estar presentes errores de transcripcin computarizados inadvertidos.

## 2019-02-02 NOTE — ED Provider Notes (Signed)
[facility] EMERGENCY DEPARTMENT Provider Note   CSN: [id number] Arrival date & time: 02/02/19  1044     History Chief Complaint  Patient presents with  . Generalized Body Aches   Spanish video interpreter used during this visit Perry Mercado is a 44 y.o. male smoker with previous history of alcohol abuse otherwise healthy no daily medication use.  Patient presents today for evaluation of generalized body aches ongoing for the past 3 days.  He describes a moderate intensity aching sensation to his bilateral upper and lower extremities. He points to his bilateral biceps, forearms and thighs to indicate where his pain is. He describes it as constant, nonradiating, worsened with activity and improved with ibuprofen.  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  He denies any known injuries or potential inciting events.  Patient is concerned that his previous alcohol abuse may be contributing to his body aches, he read online that alcohol withdrawal can cause body aches.  He reports to me that he has not had any alcohol in the past 7 months and he is now completely sober.  He denies history of alcohol withdrawal, DTs or seizures.  Patient denies any history of recent illness, fever/chills, headache, neck pain, chest pain, shortness of breath/cough, abdominal pain, nausea/vomiting, diarrhea, extremity swelling/color change, numbness/tingling, weakness, dysuria, oliguria/anuria or any additional concerns. HPI     Past Medical History:  Diagnosis Date  . Medical history non-contributory     Patient Active Problem List   Diagnosis Date Noted  . Alcohol dependence ([place]) 06/03/2013    Past Surgical History:  Procedure Laterality Date  . NO PAST SURGERIES         No family history on file.  Social History   Tobacco Use  . Smoking status: Current Every Day Smoker    Packs/day: 2.00    Types: Cigarettes  . Smokeless  tobacco: Never Used  Substance Use Topics  . Alcohol use: Yes    Comment: weekly/ binge  . Drug use: Yes    Types: Marijuana    Home Medications Prior to Admission medications   Not on File    Allergies    Patient has no known allergies.  Review of Systems   Review of Systems Ten systems are reviewed and are negative for acute change except as noted in the HPI  Physical Exam Updated Vital Signs BP 114/71 (BP Location: Right Arm)   Pulse (!) 54   Temp 98.4 F (36.9 C) (Oral)   Resp 16   SpO2 99%   Physical Exam Constitutional:      General: He is not in acute distress.    Appearance: Normal appearance. He is well-developed. He is not ill-appearing or diaphoretic.  HENT:     Head: Normocephalic and atraumatic.     Right Ear: External ear normal.     Left Ear: External ear normal.     Nose: Nose normal.  Eyes:     General: Vision grossly intact. Gaze aligned appropriately.     Pupils: Pupils are equal, round, and reactive to light.  Neck:     Trachea: Trachea and phonation normal. No tracheal deviation.  Pulmonary:     Effort: Pulmonary effort is normal. No respiratory distress.  Abdominal:     General: There is no distension.     Palpations: Abdomen is soft.     Tenderness: There is no abdominal tenderness. There is no guarding or rebound.  Musculoskeletal:        General: Normal range of motion.     Cervical back: Normal range of motion.     Comments: Major joints of the upper and lower extremities visualized without abnormality, full range of motion and appropriate strength with all movements.  Skin:    General: Skin is warm and dry.  Neurological:     Mental Status: He is alert.     GCS: GCS eye subscore is 4. GCS verbal subscore is 5. GCS motor subscore is 6.     Comments: Speech is clear and goal oriented, follows commands Major Cranial nerves without deficit, no facial droop Normal strength in upper and lower extremities bilaterally including  dorsiflexion and plantar flexion, strong and equal grip strength Sensation normal to light and sharp touch Moves extremities without ataxia, coordination intact Normal gait  Psychiatric:        Behavior: Behavior normal.    ED Results / Procedures / Treatments   Labs (all labs ordered are listed, but only abnormal results are displayed) Labs Reviewed  SARS CORONAVIRUS 2 (TAT 6-24 HRS)  CBC WITH DIFFERENTIAL/PLATELET  COMPREHENSIVE METABOLIC PANEL  CK  ETHANOL  POC SARS CORONAVIRUS 2 AG -  ED    EKG None  Radiology DG Chest Portable 1 View  Result Date: 02/02/2019 CLINICAL DATA:  Body aches for 3 days, history of alcohol abuse EXAM: PORTABLE CHEST 1 VIEW COMPARISON:  04/11/2018 FINDINGS: The heart size and mediastinal contours are within normal limits. Both lungs are clear. The visualized skeletal structures are unremarkable. IMPRESSION: No active disease. Electronically Signed   By: Sharlet Salina M.D.   On: 02/02/2019 12:58    Procedures Procedures (including critical care time)  Medications Ordered in ED Medications  sodium chloride 0.9 % bolus 500 mL (0 mLs Intravenous Stopped 02/02/19 1456)  ketorolac (TORADOL) 15 MG/ML injection 15 mg (15 mg Intramuscular Given 02/02/19 1459)    ED Course  I have reviewed the triage vital signs and the nursing notes.  Pertinent labs & imaging results that were available during my care of the patient were reviewed by me and considered in my medical decision making (see chart for details).    MDM Rules/Calculators/A&P                     44 year old male arrives today for a 3-day history of body aches, no other complaints.  He is concerned that his previous alcohol abuse may be contributing to his body aches however he denies any alcohol use in the past 7 months.  He has no history of alcohol withdrawal, delirium tremens or seizures.  He reports that he is otherwise healthy and has no daily medication use and is now sober.  On exam he is  well-appearing and in no acute distress resting comfortably in chair.  Cranial nerves intact, no meningeal signs, heart regular rate and rhythm without murmur, lungs clear to auscultation bilaterally, abdomen is soft nontender without peritoneal signs and he is neurovascular intact to all 4 extremities without evidence of DVT.  Upper and lower extremities visualized and he has full range of motion and appropriate strength of all major joints without evidence of abnormality.  Based on history he has provided today I doubt his body aches are attributed to alcohol use 7 months ago, he does not appear to be in withdrawal.  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  Additionally will obtain Covid test today however lower suspicion for Covid at this time given lack of additional infectious symptoms. - CBC within normal limits CMP within normal limits Ethanol negative CK within normal limits Rapid Covid negative Patient has received 500 mL fluid bolus and 15 mg Toradol shot, he denies history of CKD or gastric ulcers. - On reassessment patient is resting comfortably and in no acute distress.  States understanding of work-up above and has no further questions.  His send out Covid test is pending and he will follow up on his MyChart account for results in the next 1-2 days.  Suspect patient's soreness today may be secondary to his work, I have given him a work note and a referral for a PCP to schedule a follow-up appointment for further evaluation.  I have advised him to increase his water intake and to rest.  No evidence of rhabdomyolysis, kidney injury, trauma, withdrawal or other pathologies requiring further emergency department work-up.  At this time there does not appear to be any evidence of an acute emergency medical condition and the patient  appears stable for discharge with appropriate outpatient follow up. Diagnosis was discussed with patient who verbalizes understanding of care plan and is agreeable to discharge. I have discussed return precautions with patient who verbalizes understanding of return precautions. Patient encouraged to follow-up with their PCP. All questions answered.  Patient has been discharged in good condition.  Patient's case discussed with Dr. Deretha Emory who agrees with plan to discharge with follow-up.   Spanish phone interpretor used at discharge.  Note: Portions of this report may have been transcribed using voice recognition software. Every effort was made to ensure accuracy; however, inadvertent computerized transcription errors may still be present. Final Clinical Impression(s) / ED Diagnoses Final diagnoses:  Body aches    Rx / DC Orders ED Discharge Orders    None       Elizabeth Palau 02/02/19 1532    Vanetta Mulders, MD 02/08/19 [phone]

## 2019-02-02 NOTE — ED Triage Notes (Signed)
Patient complains of body aches x 3 days, reports he use to drink heavy ETOH but stopped 7 months ago. Complains of pain worse bilateral shoulder, uses construction tools daily

## 2019-08-24 ENCOUNTER — Emergency Department (HOSPITAL_COMMUNITY)
Admission: EM | Admit: 2019-08-24 | Discharge: 2019-08-24 | Disposition: A | Discharge status: Home / Self Care | Payer: Self-pay | Attending: Emergency Medicine | Admitting: Emergency Medicine

## 2019-08-24 ENCOUNTER — Other Ambulatory Visit: Payer: Self-pay

## 2019-08-24 ENCOUNTER — Encounter (HOSPITAL_COMMUNITY): Payer: Self-pay | Admitting: *Deleted

## 2019-08-24 DIAGNOSIS — F17210 Nicotine dependence, cigarettes, uncomplicated: Secondary | ICD-10-CM | POA: Insufficient documentation

## 2019-08-24 DIAGNOSIS — F10129 Alcohol abuse with intoxication, unspecified: Secondary | ICD-10-CM | POA: Insufficient documentation

## 2019-08-24 DIAGNOSIS — F10920 Alcohol use, unspecified with intoxication, uncomplicated: Secondary | ICD-10-CM

## 2019-08-24 NOTE — ED Triage Notes (Addendum)
BIB EMS after prostiute called from [facility] Rm 238 due to intoxication. Drank 5 40 oz Corona's this morning then stated his mom is bringing him money for drugs.  #[phone] Language line Sue Lush  Pt states he has a headache, drank 5 beers this morning.

## 2019-08-24 NOTE — Discharge Instructions (Addendum)
Beba alcohol solo con moderacin. Regrese aqu para [employer] cambios relacionados con su condicin.
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
## 2019-08-24 NOTE — ED Notes (Signed)
Pt wanting to leave. Ambulates without difficulty. Gait steady. Dr Jeraldine Loots spoke with Language line [phone] as well as Clinical research associate. Pt is aware he will be discharged. He denies having any questions for medical staff.

## 2019-12-22 ENCOUNTER — Encounter (HOSPITAL_COMMUNITY): Payer: Self-pay | Admitting: Emergency Medicine

## 2019-12-22 ENCOUNTER — Emergency Department (HOSPITAL_COMMUNITY)
Admission: EM | Admit: 2019-12-22 | Discharge: 2019-12-22 | Disposition: A | Discharge status: Home / Self Care | Payer: Self-pay | Attending: Emergency Medicine | Admitting: Emergency Medicine

## 2019-12-22 DIAGNOSIS — Z5321 Procedure and treatment not carried out due to patient leaving prior to being seen by health care provider: Secondary | ICD-10-CM | POA: Insufficient documentation

## 2019-12-22 DIAGNOSIS — R1084 Generalized abdominal pain: Secondary | ICD-10-CM | POA: Insufficient documentation

## 2019-12-22 NOTE — ED Notes (Signed)
Pt returned his stickers and screener visualized him getting in to a cab to go home.

## 2019-12-22 NOTE — ED Triage Notes (Signed)
Pt reports generalized abdominal pain and tenderness. No vomiting or diarrhea. Endorses drinking 10 beers today. Needs a Spanish interpreter.

## 2020-05-15 ENCOUNTER — Emergency Department (HOSPITAL_COMMUNITY)
Admission: EM | Admit: 2020-05-15 | Discharge: 2020-05-16 | Discharge status: Against Medical Advice | Payer: Self-pay | Attending: Emergency Medicine | Admitting: Emergency Medicine

## 2020-05-15 ENCOUNTER — Other Ambulatory Visit: Payer: Self-pay

## 2020-05-15 ENCOUNTER — Emergency Department (HOSPITAL_COMMUNITY): Payer: Self-pay

## 2020-05-15 ENCOUNTER — Encounter (HOSPITAL_COMMUNITY): Payer: Self-pay

## 2020-05-15 DIAGNOSIS — S0993XA Unspecified injury of face, initial encounter: Secondary | ICD-10-CM | POA: Insufficient documentation

## 2020-05-15 DIAGNOSIS — F10920 Alcohol use, unspecified with intoxication, uncomplicated: Secondary | ICD-10-CM | POA: Insufficient documentation

## 2020-05-15 DIAGNOSIS — R42 Dizziness and giddiness: Secondary | ICD-10-CM | POA: Insufficient documentation

## 2020-05-15 MED ORDER — ACETAMINOPHEN 500 MG PO TABS
1000.0000 mg | ORAL_TABLET | Freq: Once | ORAL | Status: AC
  Administered 2020-05-15: 1000 mg via ORAL
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
## 2020-05-16 NOTE — ED Notes (Signed)
Patient called for vitals x1 with no response
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
## 2022-09-29 ENCOUNTER — Encounter (HOSPITAL_COMMUNITY): Payer: Self-pay

## 2022-09-29 ENCOUNTER — Emergency Department (HOSPITAL_COMMUNITY): Payer: Self-pay

## 2022-09-29 ENCOUNTER — Emergency Department (HOSPITAL_COMMUNITY)
Admission: EM | Admit: 2022-09-29 | Discharge: 2022-09-29 | Disposition: A | Discharge status: Home / Self Care | Payer: Self-pay | Attending: Emergency Medicine | Admitting: Emergency Medicine

## 2022-09-29 ENCOUNTER — Other Ambulatory Visit: Payer: Self-pay

## 2022-09-29 DIAGNOSIS — Y908 Blood alcohol level of 240 mg/100 ml or more: Secondary | ICD-10-CM | POA: Insufficient documentation

## 2022-09-29 DIAGNOSIS — F10920 Alcohol use, unspecified with intoxication, uncomplicated: Secondary | ICD-10-CM | POA: Insufficient documentation

## 2022-09-29 DIAGNOSIS — S065XAA Traumatic subdural hemorrhage with loss of consciousness status unknown, initial encounter: Secondary | ICD-10-CM

## 2022-09-29 DIAGNOSIS — S0083XA Contusion of other part of head, initial encounter: Secondary | ICD-10-CM

## 2022-09-29 DIAGNOSIS — Z23 Encounter for immunization: Secondary | ICD-10-CM | POA: Insufficient documentation

## 2022-09-29 DIAGNOSIS — S0081XA Abrasion of other part of head, initial encounter: Secondary | ICD-10-CM | POA: Insufficient documentation

## 2022-09-29 LAB — COMPREHENSIVE METABOLIC PANEL
ALT: 45 U/L — ABNORMAL HIGH (ref 0–44)
AST: 46 U/L — ABNORMAL HIGH (ref 15–41)
Albumin: 4.5 g/dL (ref 3.5–5.0)
Alkaline Phosphatase: 77 U/L (ref 38–126)
Anion gap: 9 (ref 5–15)
BUN: 10 mg/dL (ref 6–20)
CO2: 21 mmol/L — ABNORMAL LOW (ref 22–32)
Calcium: 8.4 mg/dL — ABNORMAL LOW (ref 8.9–10.3)
Chloride: 103 mmol/L (ref 98–111)
Creatinine, Ser: 0.68 mg/dL (ref 0.61–1.24)
GFR, Estimated: >60 mL/min (ref >60)
Glucose, Bld: 113 mg/dL — ABNORMAL HIGH (ref 70–99)
Potassium: 3.7 mmol/L (ref 3.5–5.1)
Sodium: 133 mmol/L — ABNORMAL LOW (ref 135–145)
Total Bilirubin: 0.8 mg/dL (ref 0.3–1.2)
Total Protein: 8 g/dL (ref 6.5–8.1)

## 2022-09-29 LAB — CBC WITH DIFFERENTIAL/PLATELET
Abs Immature Granulocytes: 0.02 10*3/uL (ref 0.00–0.07)
Basophils Absolute: 0 10*3/uL (ref 0.0–0.1)
Basophils Relative: 0 %
Eosinophils Absolute: 0 10*3/uL (ref 0.0–0.5)
Eosinophils Relative: 0 %
HCT: 44.4 % (ref 39.0–52.0)
Hemoglobin: 15.2 g/dL (ref 13.0–17.0)
Immature Granulocytes: 0 %
Lymphocytes Relative: 27 %
Lymphs Abs: 2 10*3/uL (ref 0.7–4.0)
MCH: 31 pg (ref 26.0–34.0)
MCHC: 34.2 g/dL (ref 30.0–36.0)
MCV: 90.6 fL (ref 80.0–100.0)
Monocytes Absolute: 0.3 10*3/uL (ref 0.1–1.0)
Monocytes Relative: 4 %
Neutro Abs: 4.8 10*3/uL (ref 1.7–7.7)
Neutrophils Relative %: 69 %
Platelets: 211 10*3/uL (ref 150–400)
RBC: 4.9 MIL/uL (ref 4.22–5.81)
RDW: 12.5 % (ref 11.5–15.5)
WBC: 7.2 10*3/uL (ref 4.0–10.5)
nRBC: 0 % (ref 0.0–0.2)

## 2022-09-29 LAB — RAPID URINE DRUG SCREEN, HOSP PERFORMED
Amphetamines: NOT DETECTED
Barbiturates: NOT DETECTED
Benzodiazepines: NOT DETECTED
Cocaine: NOT DETECTED
Opiates: NOT DETECTED
Tetrahydrocannabinol: NOT DETECTED

## 2022-09-29 LAB — ETHANOL: Alcohol, Ethyl (B): 358 mg/dL (ref <10)

## 2022-09-29 MED ORDER — LIDOCAINE HCL (PF) 1 % IJ SOLN
5.0000 mL | Freq: Once | INTRAMUSCULAR | Status: DC
  Filled 2022-09-29: qty 30

## 2022-09-29 MED ORDER — TETANUS-DIPHTH-ACELL PERTUSSIS 5-2.5-18.5 LF-MCG/0.5 IM SUSY
0.5000 mL | PREFILLED_SYRINGE | Freq: Once | INTRAMUSCULAR | Status: AC
  Administered 2022-09-29: 0.5 mL via INTRAMUSCULAR
  Filled 2022-09-29: qty 0.5

## 2022-09-29 MED ORDER — ACETAMINOPHEN 500 MG PO TABS
1000.0000 mg | ORAL_TABLET | Freq: Once | ORAL | Status: AC
  Administered 2022-09-29: 1000 mg via ORAL
  Filled 2022-09-29: qty 2

## 2022-09-29 MED ORDER — LORAZEPAM 2 MG/ML IJ SOLN
1.0000 mg | Freq: Once | INTRAMUSCULAR | Status: AC
  Administered 2022-09-29: 1 mg via INTRAMUSCULAR
  Filled 2022-09-29: qty 1

## 2022-09-29 MED ORDER — SODIUM CHLORIDE 0.9 % IV BOLUS
1000.0000 mL | Freq: Once | INTRAVENOUS | Status: AC
  Administered 2022-09-29: 1000 mL via INTRAVENOUS

## 2022-09-29 MED ORDER — HALOPERIDOL LACTATE 5 MG/ML IJ SOLN
5.0000 mg | Freq: Once | INTRAMUSCULAR | Status: DC
  Filled 2022-09-29: qty 1
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
## 2022-09-29 NOTE — ED Notes (Signed)
Patient given nutrition and bus pass at discharge

## 2022-09-29 NOTE — Discharge Instructions (Addendum)
Llame a la informacin de contacto del neurocirujano que le proporcion para que realice un seguimiento de su lesin en la cabeza. Quieren repetir la tomografa computarizada de la cabeza en algn momento de las prximas semanas para asegurarse de que el sangrado que se produjo en algn momento en el pasado no est aumentando. Vuelva al departamento de emergencias si el dolor de cabeza empeora o si tiene otras inquietudes.
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
## 2022-10-01 ENCOUNTER — Encounter (HOSPITAL_COMMUNITY): Payer: Self-pay

## 2022-11-29 ENCOUNTER — Emergency Department (HOSPITAL_COMMUNITY): Payer: Self-pay

## 2022-11-29 ENCOUNTER — Other Ambulatory Visit: Payer: Self-pay

## 2022-11-29 ENCOUNTER — Emergency Department (HOSPITAL_COMMUNITY)
Admission: EM | Admit: 2022-11-29 | Discharge: 2022-11-29 | Disposition: A | Discharge status: Home / Self Care | Payer: Self-pay | Attending: Emergency Medicine | Admitting: Emergency Medicine

## 2022-11-29 ENCOUNTER — Encounter (HOSPITAL_COMMUNITY): Payer: Self-pay | Admitting: Emergency Medicine

## 2022-11-29 DIAGNOSIS — F10920 Alcohol use, unspecified with intoxication, uncomplicated: Secondary | ICD-10-CM

## 2022-11-29 DIAGNOSIS — R1013 Epigastric pain: Secondary | ICD-10-CM

## 2022-11-29 DIAGNOSIS — Y908 Blood alcohol level of 240 mg/100 ml or more: Secondary | ICD-10-CM | POA: Insufficient documentation

## 2022-11-29 DIAGNOSIS — F10129 Alcohol abuse with intoxication, unspecified: Secondary | ICD-10-CM | POA: Insufficient documentation

## 2022-11-29 DIAGNOSIS — K2920 Alcoholic gastritis without bleeding: Secondary | ICD-10-CM

## 2022-11-29 LAB — CBC WITH DIFFERENTIAL/PLATELET
Abs Immature Granulocytes: 0.04 10*3/uL (ref 0.00–0.07)
Basophils Absolute: 0 10*3/uL (ref 0.0–0.1)
Basophils Relative: 0 %
Eosinophils Absolute: 0 10*3/uL (ref 0.0–0.5)
Eosinophils Relative: 0 %
HCT: 47 % (ref 39.0–52.0)
Hemoglobin: 16.1 g/dL (ref 13.0–17.0)
Immature Granulocytes: 0 %
Lymphocytes Relative: 25 %
Lymphs Abs: 2.4 10*3/uL (ref 0.7–4.0)
MCH: 31.6 pg (ref 26.0–34.0)
MCHC: 34.3 g/dL (ref 30.0–36.0)
MCV: 92.2 fL (ref 80.0–100.0)
Monocytes Absolute: 0.4 10*3/uL (ref 0.1–1.0)
Monocytes Relative: 4 %
Neutro Abs: 6.7 10*3/uL (ref 1.7–7.7)
Neutrophils Relative %: 71 %
Platelets: 228 10*3/uL (ref 150–400)
RBC: 5.1 MIL/uL (ref 4.22–5.81)
RDW: 12.6 % (ref 11.5–15.5)
WBC: 9.6 10*3/uL (ref 4.0–10.5)
nRBC: 0 % (ref 0.0–0.2)

## 2022-11-29 LAB — COMPREHENSIVE METABOLIC PANEL
ALT: 32 U/L (ref 0–44)
AST: 52 U/L — ABNORMAL HIGH (ref 15–41)
Albumin: 4.6 g/dL (ref 3.5–5.0)
Alkaline Phosphatase: 78 U/L (ref 38–126)
Anion gap: 10 (ref 5–15)
BUN: 10 mg/dL (ref 6–20)
CO2: 24 mmol/L (ref 22–32)
Calcium: 9.4 mg/dL (ref 8.9–10.3)
Chloride: 105 mmol/L (ref 98–111)
Creatinine, Ser: 1.1 mg/dL (ref 0.61–1.24)
GFR, Estimated: >60 mL/min (ref >60)
Glucose, Bld: 122 mg/dL — ABNORMAL HIGH (ref 70–99)
Potassium: 3.8 mmol/L (ref 3.5–5.1)
Sodium: 139 mmol/L (ref 135–145)
Total Bilirubin: 0.5 mg/dL (ref <1.2)
Total Protein: 8.1 g/dL (ref 6.5–8.1)

## 2022-11-29 LAB — RAPID URINE DRUG SCREEN, HOSP PERFORMED
Amphetamines: NOT DETECTED
Barbiturates: NOT DETECTED
Benzodiazepines: NOT DETECTED
Cocaine: NOT DETECTED
Opiates: NOT DETECTED
Tetrahydrocannabinol: NOT DETECTED

## 2022-11-29 LAB — URINALYSIS, ROUTINE W REFLEX MICROSCOPIC
Bilirubin Urine: NEGATIVE
Glucose, UA: NEGATIVE mg/dL
Hgb urine dipstick: NEGATIVE
Ketones, ur: NEGATIVE mg/dL
Leukocytes,Ua: NEGATIVE
Nitrite: NEGATIVE
Protein, ur: NEGATIVE mg/dL
Specific Gravity, Urine: 1.003 — ABNORMAL LOW (ref 1.005–1.030)
pH: 5 (ref 5.0–8.0)

## 2022-11-29 LAB — ETHANOL: Alcohol, Ethyl (B): 302 mg/dL (ref <10)

## 2022-11-29 LAB — LIPASE, BLOOD: Lipase: 122 U/L — ABNORMAL HIGH (ref 11–51)

## 2022-11-29 MED ORDER — IOHEXOL 300 MG/ML  SOLN
100.0000 mL | Freq: Once | INTRAMUSCULAR | Status: AC | PRN
Start: 2022-11-29 — End: 2022-11-29
  Administered 2022-11-29: 100 mL via INTRAVENOUS

## 2022-11-29 MED ORDER — SODIUM CHLORIDE 0.9 % IV BOLUS
1000.0000 mL | Freq: Once | INTRAVENOUS | Status: AC
  Administered 2022-11-29: 1000 mL via INTRAVENOUS

## 2022-11-29 MED ORDER — OMEPRAZOLE 20 MG PO CPDR
20.0000 mg | DELAYED_RELEASE_CAPSULE | Freq: Every day | ORAL | 0 refills | Status: AC
  Filled 2022-11-29: qty 30, 30d supply, fill #0

## 2022-11-29 MED ORDER — PANTOPRAZOLE SODIUM 40 MG IV SOLR
40.0000 mg | Freq: Once | INTRAVENOUS | Status: AC
  Administered 2022-11-29: 40 mg via INTRAVENOUS
  Filled 2022-11-29: qty 10

## 2022-11-29 NOTE — ED Provider Notes (Signed)
Perry Mercado AT [facility] Provider Note   CSN: [id number] Arrival date & time: 11/29/22  1934     History  Chief Complaint  Patient presents with   Alcohol Intoxication    Perry Mercado is a 47 y.o. male.  HPI Reports a sudden onset of abdominal pain today.  He indicates central upper abdominal pain.  He reports some radiation into the back.  He denies any vomiting.  He denies any pain with eating.  He reports some constipation.  No difficulty urinating.  He denies any history of similar pain.  Patient denies prior abdominal surgeries.  Patient reports being a daily drinker however he does report large quantities of alcohol this week.  He reports drinking 7 beers today.    Home Medications Prior to Admission medications   Medication Sig Start Date End Date Taking? Authorizing Provider  omeprazole (PRILOSEC) 20 MG capsule Take 1 capsule (20 mg total) by mouth daily. 11/29/22  Yes Perry Barrette, MD      Allergies    Patient has no known allergies.    Review of Systems   Review of Systems  Physical Exam Updated Vital Signs BP 123/82   Pulse 88   Temp 98.8 F (37.1 C) (Oral)   Resp 18   Wt 81 kg   SpO2 96%   BMI 28.82 kg/m  Physical Exam Constitutional:      Comments: Patient is alert but appears intoxicated.  He is Estate agent.  No respiratory distress.  Well-nourished well-developed.  HENT:     Head: Normocephalic and atraumatic.     Mouth/Throat:     Pharynx: Oropharynx is clear.  Eyes:     Extraocular Movements: Extraocular movements intact.  Cardiovascular:     Rate and Rhythm: Normal rate and regular rhythm.  Pulmonary:     Effort: Pulmonary effort is normal.     Breath sounds: Normal breath sounds.  Abdominal:     Comments: Mild to moderate epigastric pain to palpation.  No guarding.  Lower abdomen nontender.  Musculoskeletal:        General: Normal range of motion.     Comments: No significant peripheral  edema.  Patient does have some matching erythema on the inner thighs.  This appears consistent with chafing or rubbing.  This does not extend into the groin fold or onto the scrotum.  Skin:    General: Skin is warm and dry.  Neurological:     Comments: Patient is intoxicated but alert.  Speech is clear.  No focal motor deficits.  Psychiatric:        Mood and Affect: Mood normal.     ED Results / Procedures / Treatments   Labs (all labs ordered are listed, but only abnormal results are displayed) Labs Reviewed  COMPREHENSIVE METABOLIC PANEL - Abnormal; Notable for the following components:      Result Value   Glucose, Bld 122 (*)    AST 52 (*)    All other components within normal limits  ETHANOL - Abnormal; Notable for the following components:   Alcohol, Ethyl (B) 302 (*)    All other components within normal limits  LIPASE, BLOOD - Abnormal; Notable for the following components:   Lipase 122 (*)    All other components within normal limits  URINALYSIS, ROUTINE W REFLEX MICROSCOPIC - Abnormal; Notable for the following components:   Color, Urine COLORLESS (*)    Specific Gravity, Urine 1.003 (*)    All other  components within normal limits  CBC WITH DIFFERENTIAL/PLATELET  RAPID URINE DRUG SCREEN, HOSP PERFORMED    EKG None  Radiology CT ABDOMEN PELVIS W CONTRAST  Result Date: 11/29/2022 CLINICAL DATA:  Umbilical pain EXAM: CT ABDOMEN AND PELVIS WITH CONTRAST TECHNIQUE: Multidetector CT imaging of the abdomen and pelvis was performed using the standard protocol following bolus administration of intravenous contrast. RADIATION DOSE REDUCTION: This exam was performed according to the departmental dose-optimization program which includes automated exposure control, adjustment of the mA and/or kV according to patient size and/or use of iterative reconstruction technique. CONTRAST:  OMNIPAQUE IOHEXOL 300 MG/ML  SOLN COMPARISON:  None Available. FINDINGS: Lower chest: Lung  bases demonstrate no acute airspace disease. Hepatobiliary: Hepatic steatosis. No calcified gallstone or biliary dilatation Pancreas: Unremarkable. No pancreatic ductal dilatation or surrounding inflammatory changes. Spleen: Normal in size without focal abnormality. Adrenals/Urinary Tract: Adrenal glands are unremarkable. Kidneys are normal, without renal calculi, focal lesion, or hydronephrosis. Bladder is unremarkable. Stomach/Bowel: Stomach is within normal limits. Probable appendectomy. No evidence of bowel wall thickening, distention, or inflammatory changes. Vascular/Lymphatic: No significant vascular findings are present. No enlarged abdominal or pelvic lymph nodes. Reproductive: Prostate is unremarkable. Other: No abdominal wall hernia or abnormality. No abdominopelvic ascites. Musculoskeletal: No acute or significant osseous findings. IMPRESSION: 1. No CT evidence for acute intra-abdominal or pelvic abnormality. 2. Hepatic steatosis. Electronically Signed   By: Jasmine Pang M.D.   On: 11/29/2022 22:37   DG Chest Port 1 View  Result Date: 11/29/2022 CLINICAL DATA:  Epigastric pain EXAM: PORTABLE CHEST 1 VIEW COMPARISON:  02/02/2019 FINDINGS: The lungs appear clear. Cardiac and mediastinal contours normal. No blunting of the costophrenic angles. No significant bony findings. IMPRESSION: 1. No active disease. Electronically Signed   By: Gaylyn Rong M.D.   On: 11/29/2022 21:30    Procedures Procedures    Medications Ordered in ED Medications  pantoprazole (PROTONIX) injection 40 mg (40 mg Intravenous Given 11/29/22 2030)  sodium chloride 0.9 % bolus 1,000 mL (0 mLs Intravenous Stopped 11/29/22 2307)  iohexol (OMNIPAQUE) 300 MG/ML solution 100 mL (100 mLs Intravenous Contrast Given 11/29/22 2216)    ED Course/ Medical Decision Making/ A&P                                 Medical Decision Making Amount and/or Complexity of Data Reviewed Labs: ordered. Radiology:  ordered.  Risk Prescription drug management.   Patient presents with chief complaint of abdominal pain.  He indicates epigastric pain.  Patient reports that it started pretty abruptly.  He endorses slight radiation into the back.  He reports is less painful now than it was when it started.  Patient has been drinking a lot of alcohol over the past week.  He denies any surgical history.  At this time differential diagnosis includes pancreatitis\gastritis\peptic ulcer\bowel obstruction.  Will proceed with lab work and imaging.  Will administer Protonix IV.  Patient does not endorse any vomiting or hematemesis.  Routine chemistry normal except for minor elevation in AST at 52.  GFR are normal.  Other LFTs normal.  CBC normal with normal differential.  Lipase 122 urinalysis negative.  Urine drug screen negative.  CT abdomen pelvis interpreted by radiology negative for acute findings.  No pancreatic inflammatory changes noted on CT.  Presents as outlined.  He has a very mild elevation in lipase.  CT scan does not indicate inflammatory change.  This is likely  mild alcohol induced pancreatitis.  Patient is counseled on necessity to cut back and discontinue drinking.  He is counseled also on gastritis which I suspect is a contributing factor.  I have advised starting omeprazole daily.  I have also included resources for treatment for alcohol dependence and follow-up.        Final Clinical Impression(s) / ED Diagnoses Final diagnoses:  Alcoholic intoxication without complication (HCC)  Epigastric pain  Acute alcoholic gastritis without hemorrhage    Rx / DC Orders ED Discharge Orders          Ordered    omeprazole (PRILOSEC) 20 MG capsule  Daily        11/29/22 2250              Perry Barrette, MD 11/29/22 2311
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
## 2022-11-29 NOTE — Discharge Instructions (Signed)
  Lo ms probable es que tengas gastritis alcohlica.  Esto sucede porque el alcohol irrita gravemente el revestimiento del estmago.  Debe comenzar a tomar un medicamento para ayudar a Licensed conveyancer revestimiento del estmago.  Comience con omeprazol segn lo prescrito.  Este medicamento fue enviado a la farmacia de [place].  Tambin puede comprarlo sin receta si as lo desea.  Tmelo diariamente durante las prximas 2 semanas.  El consumo continuo de alcohol provocar enfermedades hepticas y otros problemas mdicos, como lceras sangrantes en el [place].  Debe buscar tratamiento y comenzar el proceso de dejar de fumar.  Se ha incluido una gua de recursos en sus instrucciones de alta [employer] lugares a donde acudir para Veterinary surgeon.  Tambin debera contar con un mdico de familia.  Puede utilizar el nmero de referencia para recibir instrucciones que le ayuden a Dealer.

## 2022-11-30 ENCOUNTER — Other Ambulatory Visit (HOSPITAL_COMMUNITY): Payer: Self-pay

## 2022-12-10 ENCOUNTER — Other Ambulatory Visit (HOSPITAL_COMMUNITY): Payer: Self-pay

## 2023-02-18 ENCOUNTER — Emergency Department (HOSPITAL_COMMUNITY)
Admission: EM | Admit: 2023-02-18 | Discharge: 2023-02-18 | Disposition: A | Discharge status: Home / Self Care | Payer: Self-pay | Attending: Emergency Medicine | Admitting: Emergency Medicine

## 2023-02-18 ENCOUNTER — Other Ambulatory Visit: Payer: Self-pay

## 2023-02-18 DIAGNOSIS — R451 Restlessness and agitation: Secondary | ICD-10-CM | POA: Insufficient documentation

## 2023-02-18 DIAGNOSIS — K0889 Other specified disorders of teeth and supporting structures: Secondary | ICD-10-CM

## 2023-02-18 MED ORDER — AMOXICILLIN 500 MG PO CAPS: 500.0000 mg | ORAL_CAPSULE | Freq: Three times a day (TID) | ORAL | 0 refills | Status: AC

## 2023-02-18 MED ORDER — NAPROXEN 500 MG PO TABS: 500.0000 mg | ORAL_TABLET | Freq: Two times a day (BID) | ORAL | 0 refills | Status: AC | PRN

## 2023-02-18 MED ORDER — AMOXICILLIN 500 MG PO CAPS
500.0000 mg | ORAL_CAPSULE | Freq: Once | ORAL | Status: AC
  Administered 2023-02-18: 500 mg via ORAL
  Filled 2023-02-18: qty 1

## 2023-02-18 MED ORDER — OXYCODONE-ACETAMINOPHEN 5-325 MG PO TABS
1.0000 | ORAL_TABLET | Freq: Once | ORAL | Status: AC
  Administered 2023-02-18: 1 via ORAL
  Filled 2023-02-18: qty 1

## 2023-02-18 MED ORDER — NAPROXEN 500 MG PO TABS
500.0000 mg | ORAL_TABLET | Freq: Once | ORAL | Status: AC
  Administered 2023-02-18: 500 mg via ORAL
  Filled 2023-02-18: qty 1
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
## 2023-02-18 NOTE — Discharge Instructions (Addendum)
Follow-up with a dentist.  Only a dentist can fix your problem.  We recommend that you take amoxicillin as prescribed to treat likely underlying infection.  Take Naproxen as prescribed for pain control.  Return to the ED for any new or concerning symptoms.

## 2023-02-18 NOTE — ED Provider Notes (Signed)
[place] EMERGENCY DEPARTMENT AT [facility] Provider Note   CSN: [id number] Arrival date & time: 02/18/23  0003     History  Chief Complaint  Patient presents with   Dental Pain    Perry Mercado is a 48 y.o. male.  48 year old male with a history of alcohol abuse presents to the emergency department complaining of left upper dental pain.  Reports that pain has been present since the morning and constant.  The pain is causing his whole head to hurt.  No medications taken prior to arrival for symptoms.  He does endorse prior dental procedure in this area about 7 years ago.  He is not actively followed by a dentist.  No associated fevers, nausea, vomiting, recent falls or oral trauma.  The history is provided by the patient. A language interpreter was used.  Dental Pain      Home Medications Prior to Admission medications   Medication Sig Start Date End Date Taking? Authorizing Provider  amoxicillin (AMOXIL) 500 MG capsule Take 1 capsule (500 mg total) by mouth 3 (three) times daily. 02/18/23  Yes Antony Madura, PA-C  naproxen (NAPROSYN) 500 MG tablet Take 1 tablet (500 mg total) by mouth every 12 (twelve) hours as needed for mild pain (pain score 1-3) or moderate pain (pain score 4-6). 02/18/23  Yes Antony Madura, PA-C  omeprazole (PRILOSEC) 20 MG capsule Take 1 capsule (20 mg total) by mouth daily. 11/29/22   Arby Barrette, MD      Allergies    Patient has no known allergies.    Review of Systems   Review of Systems Ten systems reviewed and are negative for acute change, except as noted in the HPI.    Physical Exam Updated Vital Signs BP (!) 137/94   Pulse 76   Temp 98.5 F (36.9 C) (Oral)   Resp 20   SpO2 99%   Physical Exam Vitals and nursing note reviewed.  Constitutional:      General: He is not in acute distress.    Appearance: He is well-developed. He is not diaphoretic.  HENT:     Head: Normocephalic and atraumatic.     Mouth/Throat:      Comments: Gingival swelling above the area of the left central and lateral incisor.  No active drainage, bleeding, purulence.  No discrete or fluctuant abscess noted.  Patient without trismus, malocclusion.  Soft oral floor.  No submandibular or other facial swelling. Eyes:     General: No scleral icterus.    Conjunctiva/sclera: Conjunctivae normal.  Pulmonary:     Effort: Pulmonary effort is normal. No respiratory distress.     Comments: Respirations even and unlabored Musculoskeletal:        General: Normal range of motion.     Cervical back: Normal range of motion.  Skin:    General: Skin is warm and dry.     Coloration: Skin is not pale.     Findings: No erythema or rash.  Neurological:     Mental Status: He is alert and oriented to person, place, and time.  Psychiatric:        Behavior: Behavior is agitated.     ED Results / Procedures / Treatments   Labs (all labs ordered are listed, but only abnormal results are displayed) Labs Reviewed - No data to display  EKG None  Radiology No results found.  Procedures Procedures    Medications Ordered in ED Medications  naproxen (NAPROSYN) tablet 500 mg (500 mg Oral  Given 02/18/23 0404)  oxyCODONE-acetaminophen (PERCOCET/ROXICET) 5-325 MG per tablet 1 tablet (1 tablet Oral Given 02/18/23 0404)  amoxicillin (AMOXIL) capsule 500 mg (500 mg Oral Given 02/18/23 0404)    ED Course/ Medical Decision Making/ A&P                                 Medical Decision Making Risk Prescription drug management.   This patient presents to the ED for concern of dentalgia, this involves an extensive number of treatment options, and is a complaint that carries with it a high risk of complications and morbidity.  The differential diagnosis includes dental trauma vs dental infection vs abscess vs TMJ dysfunction vs trigeminal neuralgia vs Ludwig's angina   Co morbidities that complicate the patient evaluation  Alcohol abuse   Cardiac  Monitoring:  The patient was maintained on a cardiac monitor.  I personally viewed and interpreted the cardiac monitored which showed an underlying rhythm of: NSR   Medicines ordered and prescription drug management:  I ordered medication including Amoxicillin for tx of infection. Given Naproxen and Percocet for pain  Reevaluation of the patient after these medicines showed that the patient  remained stable I have reviewed the patients home medicines and have made adjustments as needed   Test Considered:  Panorex   Problem List / ED Course:  Patient with toothache.  No gross abscess.  Exam unconcerning for Ludwig's angina or spread of infection.  Will treat with amoxicillin and pain medicine.  Urged patient to follow-up with dentist.  Referral provided.   Reevaluation:  After the interventions noted above, I reevaluated the patient and found that they have :stayed the same   Social Determinants of Health:  Language barrier   Dispostion:  After consideration of the diagnostic results and the patients response to treatment, I feel that the patent would benefit from outpatient dental follow up as well as completion of abx course. Return precautions discussed and provided. Patient discharged in stable condition with no unaddressed concerns.          Final Clinical Impression(s) / ED Diagnoses Final diagnoses:  Dentalgia    Rx / DC Orders ED Discharge Orders          Ordered    amoxicillin (AMOXIL) 500 MG capsule  3 times daily        02/18/23 0400    naproxen (NAPROSYN) 500 MG tablet  Every 12 hours PRN        02/18/23 0400              Antony Madura, PA-C 02/18/23 0420    Nira Conn, MD 02/18/23 [phone]

## 2023-12-14 ENCOUNTER — Encounter (HOSPITAL_COMMUNITY): Payer: Self-pay | Admitting: *Deleted

## 2023-12-14 ENCOUNTER — Emergency Department (HOSPITAL_COMMUNITY)
Admission: EM | Admit: 2023-12-14 | Discharge: 2023-12-15 | Disposition: A | Discharge status: Home / Self Care | Payer: Self-pay | Attending: Emergency Medicine | Admitting: Emergency Medicine

## 2023-12-14 ENCOUNTER — Emergency Department (HOSPITAL_COMMUNITY)
Admission: EM | Admit: 2023-12-14 | Discharge: 2023-12-14 | Discharge status: Absent without leave | Payer: Self-pay | Attending: Emergency Medicine | Admitting: Emergency Medicine

## 2023-12-14 ENCOUNTER — Other Ambulatory Visit: Payer: Self-pay

## 2023-12-14 DIAGNOSIS — F10920 Alcohol use, unspecified with intoxication, uncomplicated: Secondary | ICD-10-CM

## 2023-12-14 DIAGNOSIS — M545 Low back pain, unspecified: Secondary | ICD-10-CM | POA: Insufficient documentation

## 2023-12-14 DIAGNOSIS — Y908 Blood alcohol level of 240 mg/100 ml or more: Secondary | ICD-10-CM | POA: Insufficient documentation

## 2023-12-14 DIAGNOSIS — F10129 Alcohol abuse with intoxication, unspecified: Secondary | ICD-10-CM | POA: Insufficient documentation

## 2023-12-14 DIAGNOSIS — E876 Hypokalemia: Secondary | ICD-10-CM | POA: Insufficient documentation

## 2023-12-14 NOTE — ED Triage Notes (Signed)
 Triage using spanish interpreter. Pt is quite intoxicated and admits to drinking too much alcohol  this pm.  Pt is here for left lower back pain without recent injury or trauma.  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

## 2023-12-15 LAB — CBC WITH DIFFERENTIAL/PLATELET
Abs Immature Granulocytes: 0.01 K/uL (ref 0.00–0.07)
Basophils Absolute: 0 K/uL (ref 0.0–0.1)
Basophils Relative: 1 %
Eosinophils Absolute: 0 K/uL (ref 0.0–0.5)
Eosinophils Relative: 1 %
HCT: 41.3 % (ref 39.0–52.0)
Hemoglobin: 14.2 g/dL (ref 13.0–17.0)
Immature Granulocytes: 0 %
Lymphocytes Relative: 37 %
Lymphs Abs: 2 K/uL (ref 0.7–4.0)
MCH: 32.8 pg (ref 26.0–34.0)
MCHC: 34.4 g/dL (ref 30.0–36.0)
MCV: 95.4 fL (ref 80.0–100.0)
Monocytes Absolute: 0.3 K/uL (ref 0.1–1.0)
Monocytes Relative: 6 %
Neutro Abs: 3.1 K/uL (ref 1.7–7.7)
Neutrophils Relative %: 55 %
Platelets: 215 K/uL (ref 150–400)
RBC: 4.33 MIL/uL (ref 4.22–5.81)
RDW: 12.4 % (ref 11.5–15.5)
WBC: 5.6 K/uL (ref 4.0–10.5)
nRBC: 0 % (ref 0.0–0.2)

## 2023-12-15 LAB — URINALYSIS, ROUTINE W REFLEX MICROSCOPIC
Bilirubin Urine: NEGATIVE
Glucose, UA: NEGATIVE mg/dL
Hgb urine dipstick: NEGATIVE
Ketones, ur: NEGATIVE mg/dL
Leukocytes,Ua: NEGATIVE
Nitrite: NEGATIVE
Protein, ur: NEGATIVE mg/dL
Specific Gravity, Urine: 1.002 — ABNORMAL LOW (ref 1.005–1.030)
pH: 6 (ref 5.0–8.0)

## 2023-12-15 LAB — COMPREHENSIVE METABOLIC PANEL WITH GFR
ALT: 23 U/L (ref 0–44)
AST: 29 U/L (ref 15–41)
Albumin: 4.7 g/dL (ref 3.5–5.0)
Alkaline Phosphatase: 76 U/L (ref 38–126)
Anion gap: 13 (ref 5–15)
BUN: 12 mg/dL (ref 6–20)
CO2: 24 mmol/L (ref 22–32)
Calcium: 9.3 mg/dL (ref 8.9–10.3)
Chloride: 103 mmol/L (ref 98–111)
Creatinine, Ser: 0.8 mg/dL (ref 0.61–1.24)
GFR, Estimated: >60 mL/min (ref >60)
Glucose, Bld: 100 mg/dL — ABNORMAL HIGH (ref 70–99)
Potassium: 3.4 mmol/L — ABNORMAL LOW (ref 3.5–5.1)
Sodium: 141 mmol/L (ref 135–145)
Total Bilirubin: 0.4 mg/dL (ref 0.0–1.2)
Total Protein: 7 g/dL (ref 6.5–8.1)

## 2023-12-15 LAB — ETHANOL: Alcohol, Ethyl (B): 297 mg/dL — ABNORMAL HIGH (ref <15)

## 2023-12-15 MED ORDER — SODIUM CHLORIDE 0.9 % IV BOLUS
1000.0000 mL | Freq: Once | INTRAVENOUS | Status: AC
  Administered 2023-12-15: 1000 mL via INTRAVENOUS

## 2023-12-15 MED ORDER — IBUPROFEN 400 MG PO TABS
400.0000 mg | ORAL_TABLET | Freq: Once | ORAL | Status: AC
  Administered 2023-12-15: 400 mg via ORAL
  Filled 2023-12-15: qty 1

## 2023-12-15 MED ORDER — POTASSIUM CHLORIDE CRYS ER 20 MEQ PO TBCR
40.0000 meq | EXTENDED_RELEASE_TABLET | Freq: Once | ORAL | Status: AC
  Administered 2023-12-15: 40 meq via ORAL
  Filled 2023-12-15: qty 2

## 2023-12-15 MED ORDER — ACETAMINOPHEN 325 MG PO TABS
650.0000 mg | ORAL_TABLET | Freq: Once | ORAL | Status: AC
  Administered 2023-12-15: 650 mg via ORAL
  Filled 2023-12-15: qty 2

## 2023-12-15 NOTE — ED Notes (Signed)
Pt was given urinal and advised that urine sample is needed 

## 2023-12-15 NOTE — ED Provider Notes (Signed)
 [place] EMERGENCY DEPARTMENT AT [facility] Provider Note   CSN: [id number] Arrival date & time: 12/14/23  2352     Patient presents with: Alcohol  Intoxication and Back Pain   Perry Mercado is a 48 y.o. male.   The history is provided by the patient. A language interpreter was used.  Alcohol  Intoxication  Back Pain  He had told the triage nurse that he he had drank too much alcohol  and was complaining of left lower back pain without history of trauma.  Unfortunately, on my initial evaluation, he was sleeping and unable to be aroused.    Prior to Admission medications  Medication Sig Start Date End Date Taking? Authorizing Provider  amoxicillin  (AMOXIL ) 500 MG capsule Take 1 capsule (500 mg total) by mouth 3 (three) times daily. 02/18/23   Keith Sor, PA-C  naproxen  (NAPROSYN ) 500 MG tablet Take 1 tablet (500 mg total) by mouth every 12 (twelve) hours as needed for mild pain (pain score 1-3) or moderate pain (pain score 4-6). 02/18/23   Keith Sor, PA-C  omeprazole  (PRILOSEC) 20 MG capsule Take 1 capsule (20 mg total) by mouth daily. 11/29/22   Armenta Canning, MD    Allergies: Patient has no known allergies.    Review of Systems  Musculoskeletal:  Positive for back pain.  All other systems reviewed and are negative.   Updated Vital Signs BP 117/84 (BP Location: Left Arm)   Pulse 73   Temp 98 F (36.7 C) (Oral)   Resp 16   SpO2 93%   Physical Exam Vitals and nursing note reviewed.   48 year old male, resting comfortably and in no acute distress. Vital signs are normal, but when I went into the room his most recent blood pressure was 89/64 which is abnormally low. Oxygen saturation is 93%, which is normal. Head is normocephalic and atraumatic. PERRLA. Neck is nontender. Back is nontender. Lungs are clear without rales, wheezes, or rhonchi. Chest is nontender. Heart has regular rate and rhythm without murmur. Abdomen is soft, flat,  nontender. Extremities have no swelling or deformity. Skin is warm and dry without rash. Neurologic: Sleeping and unarousable.  (all labs ordered are listed, but only abnormal results are displayed) Labs Reviewed  COMPREHENSIVE METABOLIC PANEL WITH GFR - Abnormal; Notable for the following components:      Result Value   Potassium 3.4 (*)    Glucose, Bld 100 (*)    All other components within normal limits  ETHANOL - Abnormal; Notable for the following components:   Alcohol , Ethyl (B) 297 (*)    All other components within normal limits  URINALYSIS, ROUTINE W REFLEX MICROSCOPIC - Abnormal; Notable for the following components:   Color, Urine STRAW (*)    Specific Gravity, Urine 1.002 (*)    All other components within normal limits  CBC WITH DIFFERENTIAL/PLATELET      Procedures   Medications Ordered in the ED - No data to display                                  Medical Decision Making Amount and/or Complexity of Data Reviewed Labs: ordered.  Risk OTC drugs. Prescription drug management.   Report of left lumbar pain, but I am unable to get any history from him currently.  I do note a low blood pressure on going in the room.  I have reviewed his past records, and he has had  multiple ED visits for alcohol  intoxication, most recently on 11/09/2022.  I have ordered screening labs, IV fluids.  I will need to repeat his exam once he is awake and able to cooperate.  Of note, CT of abdomen and pelvis on 11/29/2022 showed no evidence of abdominal aortic aneurysm.  Patient has awakened, blood pressures come up with fluids.  He states that he has had pain in his left lumbar area for the last 3-4 days.  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  He now complains of tenderness in the left lumbar area, but there is no midline tenderness.  This area was nontender while he was sleeping.  I have reviewed his laboratory tests, and my interpretation is normal  urinalysis, borderline hypokalemia and I have ordered a dose of oral potassium, markedly elevated ethanol level consistent with ethanol intoxication, normal CBC.  I am discharging him with instructions to apply ice, use over-the-counter NSAIDs and acetaminophen  as needed for pain.  I have encouraged him to stop drinking alcohol .     Final diagnoses:  Acute left-sided low back pain without sciatica  Alcohol  intoxication, uncomplicated  Hypokalemia    ED Discharge Orders     None          Raford Lenis, MD 12/15/23 [phone]

## 2023-12-15 NOTE — Discharge Instructions (Addendum)
 Aplique hielo segn sea necesario. Puede aplicar hielo durante treinta minutos seguidos, cuatro veces al [facility].  Tome paracetamol y/o ibuprofeno segn sea necesario para engineer, materials.  No consuma alcohol !

## 2023-12-26 ENCOUNTER — Emergency Department (HOSPITAL_COMMUNITY): Payer: Self-pay

## 2023-12-26 ENCOUNTER — Other Ambulatory Visit: Payer: Self-pay

## 2023-12-26 ENCOUNTER — Emergency Department (HOSPITAL_COMMUNITY)
Admission: EM | Admit: 2023-12-26 | Discharge: 2023-12-26 | Disposition: A | Discharge status: Home / Self Care | Payer: Self-pay | Attending: Emergency Medicine | Admitting: Emergency Medicine

## 2023-12-26 DIAGNOSIS — M899 Disorder of bone, unspecified: Secondary | ICD-10-CM | POA: Insufficient documentation

## 2023-12-26 DIAGNOSIS — M25552 Pain in left hip: Secondary | ICD-10-CM

## 2023-12-26 DIAGNOSIS — M898X9 Other specified disorders of bone, unspecified site: Secondary | ICD-10-CM

## 2023-12-26 NOTE — ED Notes (Signed)
 Patient transported to X-ray

## 2023-12-26 NOTE — Discharge Instructions (Addendum)
 As we discussed there is no evidence of fracture or dislocation on imaging today. There was a benign bony growth or bone spur seen on the left hip which may be causing your pain. You can take over-the-counter medications for pain management.  Follow-up with orthopedics within the next 2 weeks for ongoing management.  Please return if symptoms worsen or any new symptoms develop.  Please use Tylenol  or ibuprofen  for pain.  You may use 600 mg ibuprofen  every 6 hours or 1000 mg of Tylenol  every 6 hours.  You may choose to alternate between the 2.  This would be most effective.  Not to exceed 4 g of Tylenol  within 24 hours.  Not to exceed 3200 mg ibuprofen  24 hours.  Como comentamos, las imgenes radiolgicas de hoy no muestran evidencia de fractura ni dislocacin. Se observ un crecimiento seo benigno o espoln seo en la cadera izquierda, que podra ser la causa de su dolor. Puede tomar analgsicos de venta libre para engineer, materials.  Debe programar una cita de seguimiento con el ortopedista en las prximas dos semanas para continuar con el [place].  Por favor, regrese si los sntomas empeoran o si presenta algn sntoma nuevo.  Puede usar paracetamol o ibuprofeno para el dolor. Puede tomar 600 mg de ibuprofeno cada 6 horas o 1000 mg de paracetamol cada 6 horas. Puede alternar entre ambos medicamentos. Esto sera lo ms efectivo. No exceda los 4 g de paracetamol en 24 horas. No exceda los 3200 mg de ibuprofeno en 24 horas.

## 2023-12-26 NOTE — ED Triage Notes (Addendum)
 Patient arrives via [facility] for back pain x3 weeks. Patient had fall three weeks again with associated lower back pain that has progressively gotten worse. Patient endorses it is very hard to walk especially in the morning. Patient endorses drinking 4 beers today with little alcohol  intake daily. Spanish interpreter used for triage.   EMS vitals BP 126/64 HR 90 95 on room air

## 2023-12-26 NOTE — ED Notes (Signed)
 Patient reports that he will call ride to pick him up from the waiting room.

## 2023-12-27 NOTE — ED Provider Notes (Addendum)
 " [place] EMERGENCY DEPARTMENT AT [facility] Provider Note   CSN: [id number] Arrival date & time: 12/26/23  1648     Patient presents with: Back Pain   Perry Mercado is a 48 y.o. male.  Patient is here for evaluation of left hip pain x 2 weeks.  Denies any injuries or falls.  Denies knee or foot pain.  Denies urinary or bowel incontinence.  Denies saddle anesthesia.  Denies dysuria, hematuria, or increased urinary frequency.  He is not sure of the cause of this hip pain.  He is able to ambulate with some discomfort.  Patient smells of alcohol .  He reports having 4 beers prior to coming to the ED today.  When asked if he drinks regularly he responds yes.  When asked if he could have possibly blacked out and fallen he says no.  Patient able to answer all orientation questions correctly.  The history is provided by the patient.  Back Pain      Prior to Admission medications  Medication Sig Start Date End Date Taking? Authorizing Provider  amoxicillin  (AMOXIL ) 500 MG capsule Take 1 capsule (500 mg total) by mouth 3 (three) times daily. 02/18/23   Keith Sor, PA-C  naproxen  (NAPROSYN ) 500 MG tablet Take 1 tablet (500 mg total) by mouth every 12 (twelve) hours as needed for mild pain (pain score 1-3) or moderate pain (pain score 4-6). 02/18/23   Keith Sor, PA-C  omeprazole  (PRILOSEC) 20 MG capsule Take 1 capsule (20 mg total) by mouth daily. 11/29/22   Armenta Canning, MD    Allergies: Patient has no known allergies.    Review of Systems  Musculoskeletal:  Positive for back pain.    Updated Vital Signs BP 103/62 (BP Location: Left Arm)   Pulse 94   Temp 98 F (36.7 C) (Oral)   Resp 20   Ht 5' 6 (1.676 m)   Wt 77.1 kg   SpO2 94%   BMI 27.44 kg/m   Physical Exam Vitals and nursing note reviewed.  Constitutional:      General: He is not in acute distress.    Appearance: Normal appearance. He is not ill-appearing, toxic-appearing or diaphoretic.  HENT:      Head: Normocephalic and atraumatic.  Eyes:     General: No scleral icterus.    Extraocular Movements: Extraocular movements intact.     Conjunctiva/sclera: Conjunctivae normal.  Pulmonary:     Effort: Pulmonary effort is normal. No respiratory distress.  Abdominal:     General: Abdomen is flat. There is no distension.     Palpations: Abdomen is soft.     Tenderness: There is no abdominal tenderness. There is no right CVA tenderness, left CVA tenderness or guarding.  Musculoskeletal:        General: Tenderness (Reproducible pain with palpation over the left hip with no obvious injury, deformities, or skin changes.) present. No swelling, deformity or signs of injury. Normal range of motion.  Skin:    General: Skin is warm and dry.     Coloration: Skin is not jaundiced or pale.  Neurological:     Mental Status: He is alert and oriented to person, place, and time.     (all labs ordered are listed, but only abnormal results are displayed) Labs Reviewed - No data to display  EKG: None  Radiology: DG Hip Unilat With Pelvis 2-3 Views Left Result Date: 12/26/2023 EXAM: 2 OR MORE VIEW(S) XRAY OF THE LEFT HIP 12/26/2023 07:52:00 PM COMPARISON:  None available. CLINICAL HISTORY: left hip pain FINDINGS: BONES AND JOINTS: Small exostosis arising from the subtrochanteric region of the proximal left femur. No acute fracture. No malalignment. SOFT TISSUES: The soft tissues are unremarkable. IMPRESSION: 1. Small exostosis arising from the subtrochanteric region of the proximal left femur. Evidence of fractures is not seen. 2. Consider MRI if there are continued localizing symptoms . Electronically signed by: Francis Quam MD 12/26/2023 08:02 PM EST RP Workstation: [id number]     Procedures   Medications Ordered in the ED - No data to display    Patient presents to the ED for concern of left hip pain, this involves an extensive number of treatment options, and is a complaint that carries with it  a high risk of complications and morbidity.  The differential diagnosis includes fracture, dislocation, soft tissue injury, pyelonephritis, musculoskeletal.   Imaging Studies ordered:  I ordered imaging studies including left hip x-ray I independently visualized and interpreted imaging which showed no acute findings I agree with the radiologist interpretation   Problem List / ED Course:     Left hip pain.  Physical exam is unremarkable for acute findings.  Imaging shows no fracture or dislocation.  Most likely musculoskeletal in nature.  Return precautions given.  Stable for discharge.   Reevaluation:  After the interventions noted above, I reevaluated the patient and found that they have :stayed the same   Dispostion:  After consideration of the diagnostic results and the patients response to treatment, I feel that the patent would benefit from supportive care in the home setting using over-the-counter medications for pain management and follow-up with orthopedics for ongoing management.  Return precautions given.   Medical Decision Making Amount and/or Complexity of Data Reviewed Radiology: ordered.   This note was produced using Electronics Engineer. While the provider has reviewed and verified all clinical information, transcription errors may remain.    Final diagnoses:  Left hip pain  Exostosis    ED Discharge Orders     None          Rosina Almarie LABOR, PA-C 12/27/23 0050    Rosina Almarie LABOR, PA-C 12/27/23 0051    Pamella Ozell LABOR, DO 12/29/23 2018  "

## 2024-01-06 ENCOUNTER — Other Ambulatory Visit: Payer: Self-pay
